# Patient Record
Sex: Female | Born: 1985 | Race: White | Hispanic: No | Marital: Single | Smoking: Current every day smoker
Health system: Southern US, Community
[De-identification: ages and names within clinical notes are randomized; demographics above are authoritative.]

## PROBLEM LIST (undated history)

## (undated) HISTORY — PX: LIVER SURGERY: SHX698

## (undated) HISTORY — PX: CHOLECYSTECTOMY: SHX55

## (undated) HISTORY — PX: TUBAL LIGATION: SHX77

---

## 2014-04-07 ENCOUNTER — Emergency Department: Payer: Self-pay | Admitting: Emergency Medicine

## 2014-04-07 LAB — LIPASE, BLOOD: Lipase: 79 U/L (ref 73–393)

## 2014-04-07 LAB — CBC WITH DIFFERENTIAL/PLATELET
BASOS PCT: 0.5 %
Basophil #: 0.1 10*3/uL (ref 0.0–0.1)
EOS PCT: 0.6 %
Eosinophil #: 0.1 10*3/uL (ref 0.0–0.7)
HCT: 43.9 % (ref 35.0–47.0)
HGB: 14.5 g/dL (ref 12.0–16.0)
LYMPHS ABS: 1.2 10*3/uL (ref 1.0–3.6)
Lymphocyte %: 10.1 %
MCH: 29.7 pg (ref 26.0–34.0)
MCHC: 33 g/dL (ref 32.0–36.0)
MCV: 90 fL (ref 80–100)
MONOS PCT: 3.6 %
Monocyte #: 0.4 x10 3/mm (ref 0.2–0.9)
NEUTROS ABS: 9.9 10*3/uL — AB (ref 1.4–6.5)
Neutrophil %: 85.2 %
Platelet: 168 10*3/uL (ref 150–440)
RBC: 4.87 10*6/uL (ref 3.80–5.20)
RDW: 14.6 % — ABNORMAL HIGH (ref 11.5–14.5)
WBC: 11.6 10*3/uL — AB (ref 3.6–11.0)

## 2014-04-07 LAB — COMPREHENSIVE METABOLIC PANEL
ALBUMIN: 3.9 g/dL (ref 3.4–5.0)
ALK PHOS: 43 U/L — AB
ALT: 33 U/L
Anion Gap: 8 (ref 7–16)
BUN: 9 mg/dL (ref 7–18)
Bilirubin,Total: 0.4 mg/dL (ref 0.2–1.0)
CO2: 22 mmol/L (ref 21–32)
CREATININE: 0.46 mg/dL — AB (ref 0.60–1.30)
Calcium, Total: 8.7 mg/dL (ref 8.5–10.1)
Chloride: 109 mmol/L — ABNORMAL HIGH (ref 98–107)
EGFR (African American): >60
Glucose: 119 mg/dL — ABNORMAL HIGH (ref 65–99)
Osmolality: 277 (ref 275–301)
Potassium: 5.1 mmol/L (ref 3.5–5.1)
SGOT(AST): 62 U/L — ABNORMAL HIGH (ref 15–37)
Sodium: 139 mmol/L (ref 136–145)
Total Protein: 7.5 g/dL (ref 6.4–8.2)

## 2014-04-07 LAB — URINALYSIS, COMPLETE
BILIRUBIN, UR: NEGATIVE
Bacteria: NONE SEEN
Blood: NEGATIVE
GLUCOSE, UR: NEGATIVE mg/dL (ref 0–75)
Leukocyte Esterase: NEGATIVE
NITRITE: NEGATIVE
PH: 8 (ref 4.5–8.0)
PROTEIN: NEGATIVE
RBC,UR: NONE SEEN /HPF (ref 0–5)
SPECIFIC GRAVITY: 1.013 (ref 1.003–1.030)
Squamous Epithelial: 41
WBC UR: 2 /HPF (ref 0–5)

## 2014-04-07 LAB — HCG, QUANTITATIVE, PREGNANCY: Beta Hcg, Quant.: <1 m[IU]/mL — ABNORMAL LOW

## 2015-12-20 ENCOUNTER — Encounter: Payer: Self-pay | Admitting: Emergency Medicine

## 2015-12-20 ENCOUNTER — Emergency Department
Admission: EM | Admit: 2015-12-20 | Discharge: 2015-12-20 | Disposition: A | Discharge status: Home / Self Care | Payer: Medicaid Other | Attending: Emergency Medicine | Admitting: Emergency Medicine

## 2015-12-20 DIAGNOSIS — J029 Acute pharyngitis, unspecified: Secondary | ICD-10-CM | POA: Diagnosis present

## 2015-12-20 DIAGNOSIS — F17200 Nicotine dependence, unspecified, uncomplicated: Secondary | ICD-10-CM | POA: Insufficient documentation

## 2015-12-20 LAB — POCT RAPID STREP A: STREPTOCOCCUS, GROUP A SCREEN (DIRECT): NEGATIVE

## 2015-12-20 MED ORDER — AMOXICILLIN 500 MG PO TABS
500.0000 mg | ORAL_TABLET | Freq: Two times a day (BID) | ORAL | Status: DC
Start: 2015-12-20 — End: 2016-01-24

## 2015-12-20 NOTE — ED Provider Notes (Signed)
[facility] Emergency Department Provider Note  ____________________________________________  Time seen: Approximately 8:03 AM  I have reviewed the triage vital signs and the nursing notes.   HISTORY  Chief Complaint Sore Throat   HPI Jean Ramirez is a 30 y.o. female, NAD, presents to the emergency room today with a four day history of sore throat and cough. She states the sore throat began on Saturday and progressively worsened. The cough is productive of green-yellow phlegm. She states that her throat and neck feel swollen and it has become painful to swallow. She has taken tylenol and used cough drops with no relief. Denies fever, rhinorrhea, sinus pain, or chest congestion.  History reviewed. No pertinent past medical history.  There are no active problems to display for this patient.   Past Surgical History  Procedure Laterality Date  . Cholecystectomy    . Liver surgery      Current Outpatient Rx  Name  Route  Sig  Dispense  Refill  . amoxicillin (AMOXIL) 500 MG tablet   Oral   Take 1 tablet (500 mg total) by mouth 2 (two) times daily.   20 tablet   0     Allergies Review of patient's allergies indicates no known allergies.  History reviewed. No pertinent family history.  Social History Social History  Substance Use Topics  . Smoking status: Current Every Day Smoker  . Smokeless tobacco: None  . Alcohol Use: No    Review of Systems Constitutional: Negative for fever. Eyes: No visual changes. ENT: Positive for sore throat; Positive for difficulty swallowing. Respiratory: Denies shortness of breath. Gastrointestinal: No abdominal pain.  No nausea, no vomiting.  No diarrhea.  Genitourinary: Negative for dysuria. Musculoskeletal: Negative for generalized body aches. Skin: Negative for rash. Neurological: Negative for headaches, focal weakness or numbness.  10-point ROS otherwise  negative.  ____________________________________________   PHYSICAL EXAM:  VITAL SIGNS: ED Triage Vitals  Enc Vitals Group     BP 12/20/15 0756 115/87 mmHg     Pulse Rate 12/20/15 0756 95     Resp 12/20/15 0756 18     Temp 12/20/15 0756 97.7 F (36.5 C)     Temp Source 12/20/15 0756 Oral     SpO2 12/20/15 0756 97 %     Weight 12/20/15 0756 105 lb (47.628 kg)     Height 12/20/15 0756  (1.549 m)     Head Cir --      Peak Flow --      Pain Score --      Pain Loc --      Pain Edu? --      Excl. in GC? --    Constitutional: Alert and oriented. Well appearing and in no acute distress. Eyes: Conjunctivae are normal. PERRL. EOMI. Head: Atraumatic. Nose: No congestion/rhinnorhea. Mouth/Throat: Mucous membranes are moist.  Oropharynx erythematous and mildly swollen, without exudate. Neck: No stridor.  Lymphatic: Lymphadenopathy: mild left-sided tonsillar lymphadenopathy Cardiovascular: Normal rate, regular rhythm. Good peripheral circulation. Respiratory: Normal respiratory effort. Lungs CTAB. Gastrointestinal: Soft and nontender. Musculoskeletal: No lower extremity tenderness nor edema.   Neurologic:  Normal speech and language. No gross focal neurologic deficits are appreciated. Speech is normal. No gait instability. Skin:  Skin is warm, dry and intact. No rash noted Psychiatric: Mood and affect are normal. Speech and behavior are normal.  ____________________________________________   LABS (all labs ordered are listed, but only abnormal results are displayed)  Labs Reviewed  POCT RAPID STREP A  ____________________________________________  EKG   ____________________________________________  RADIOLOGY   ____________________________________________   PROCEDURES  Procedure(s) performed: None  Critical Care performed: No  ____________________________________________   INITIAL IMPRESSION / ASSESSMENT AND PLAN / ED COURSE  Pertinent labs & imaging  results that were available during my care of the patient were reviewed by me and considered in my medical decision making (see chart for details).  Patient states that her symptoms seem to be worse each day. She has tried tylenol without relief. She will be given a prescription for amoxicillin today. She was advised to return to the ER or follow up with the PCP of her choice for symptoms that are not improving over the next few days. ____________________________________________   FINAL CLINICAL IMPRESSION(S) / ED DIAGNOSES  Final diagnoses:  Acute pharyngitis, unspecified etiology      Chinita PesterCari B Richa Shor, FNP 12/20/15 1016  Jennye MoccasinBrian S Quigley, MD 12/20/15 1120

## 2015-12-20 NOTE — Discharge Instructions (Signed)

## 2015-12-20 NOTE — ED Notes (Signed)
Pt presents with sore throat and swollen glands. Pt reports cough with green phlegm; no cough noted during assessment or triage. Pt alert & oriented with NAD noted.

## 2015-12-20 NOTE — ED Notes (Signed)
Pt presents with sore throat and swollen glands on left side x 4 days. No fever, chills reported. NAD noted.

## 2015-12-20 NOTE — ED Notes (Signed)
Pt discharged home after verbalizing understanding of discharge instructions; nad noted. 

## 2016-01-24 ENCOUNTER — Emergency Department
Admission: EM | Admit: 2016-01-24 | Discharge: 2016-01-25 | Disposition: A | Discharge status: Home / Self Care | Payer: Medicaid Other | Attending: Emergency Medicine | Admitting: Emergency Medicine

## 2016-01-24 ENCOUNTER — Encounter: Payer: Self-pay | Admitting: Emergency Medicine

## 2016-01-24 DIAGNOSIS — F10129 Alcohol abuse with intoxication, unspecified: Secondary | ICD-10-CM | POA: Insufficient documentation

## 2016-01-24 DIAGNOSIS — F332 Major depressive disorder, recurrent severe without psychotic features: Secondary | ICD-10-CM | POA: Diagnosis not present

## 2016-01-24 DIAGNOSIS — Y999 Unspecified external cause status: Secondary | ICD-10-CM | POA: Diagnosis not present

## 2016-01-24 DIAGNOSIS — S0083XA Contusion of other part of head, initial encounter: Secondary | ICD-10-CM | POA: Diagnosis not present

## 2016-01-24 DIAGNOSIS — R45851 Suicidal ideations: Secondary | ICD-10-CM | POA: Diagnosis present

## 2016-01-24 DIAGNOSIS — W1839XA Other fall on same level, initial encounter: Secondary | ICD-10-CM | POA: Insufficient documentation

## 2016-01-24 DIAGNOSIS — R51 Headache: Secondary | ICD-10-CM | POA: Diagnosis not present

## 2016-01-24 DIAGNOSIS — Z046 Encounter for general psychiatric examination, requested by authority: Secondary | ICD-10-CM

## 2016-01-24 DIAGNOSIS — F10920 Alcohol use, unspecified with intoxication, uncomplicated: Secondary | ICD-10-CM

## 2016-01-24 DIAGNOSIS — F17200 Nicotine dependence, unspecified, uncomplicated: Secondary | ICD-10-CM | POA: Diagnosis not present

## 2016-01-24 DIAGNOSIS — Y929 Unspecified place or not applicable: Secondary | ICD-10-CM | POA: Diagnosis not present

## 2016-01-24 DIAGNOSIS — Y9389 Activity, other specified: Secondary | ICD-10-CM | POA: Diagnosis not present

## 2016-01-24 DIAGNOSIS — F10929 Alcohol use, unspecified with intoxication, unspecified: Secondary | ICD-10-CM

## 2016-01-24 DIAGNOSIS — W19XXXA Unspecified fall, initial encounter: Secondary | ICD-10-CM

## 2016-01-24 DIAGNOSIS — F1219 Cannabis abuse with unspecified cannabis-induced disorder: Secondary | ICD-10-CM | POA: Insufficient documentation

## 2016-01-24 DIAGNOSIS — F1210 Cannabis abuse, uncomplicated: Secondary | ICD-10-CM

## 2016-01-24 LAB — POCT PREGNANCY, URINE: Preg Test, Ur: NEGATIVE

## 2016-01-24 LAB — CBC
HCT: 39 % (ref 35.0–47.0)
Hemoglobin: 13.4 g/dL (ref 12.0–16.0)
MCH: 29.4 pg (ref 26.0–34.0)
MCHC: 34.3 g/dL (ref 32.0–36.0)
MCV: 85.7 fL (ref 80.0–100.0)
PLATELETS: 163 10*3/uL (ref 150–440)
RBC: 4.54 MIL/uL (ref 3.80–5.20)
RDW: 16.3 % — AB (ref 11.5–14.5)
WBC: 7.9 10*3/uL (ref 3.6–11.0)

## 2016-01-24 MED ORDER — SODIUM CHLORIDE 0.9 % IV BOLUS (SEPSIS)
1000.0000 mL | Freq: Once | INTRAVENOUS | Status: AC
  Administered 2016-01-25: 1000 mL via INTRAVENOUS

## 2016-01-24 NOTE — ED Provider Notes (Signed)
[facility] Emergency Department Provider Note   ____________________________________________  Time seen: Approximately 11:58 PM  I have reviewed the triage vital signs and the nursing notes.   HISTORY  Chief Complaint Fall and Psychiatric Evaluation  Limited secondary to intoxication  HPI Jean Ramirez is a [age] y.o. female brought by her friend to the ED for a chief complaint of fall with facial pain, intoxication and suicidal thoughts. Friend statespatient had been drinking heavily and fell, striking face. Triage nurse reports patient voicing suicidal thoughts without plan and also requesting detox. Complains of facial pain secondary to fall. Rest of history is limited secondary to patient's intoxication.   Past medical history None  There are no active problems to display for this patient.   Past Surgical History  Procedure Laterality Date  . Cholecystectomy    . Liver surgery      No current outpatient prescriptions on file.  Allergies Review of patient's allergies indicates no known allergies.  No family history on file.  Social History Social History  Substance Use Topics  . Smoking status: Current Every Day Smoker -- 1.00 packs/day  . Smokeless tobacco: None  . Alcohol Use: Yes    Review of Systems  Constitutional: No fever/chills. Eyes: No visual changes. ENT: Positive for facial pain. No sore throat. Cardiovascular: Denies chest pain. Respiratory: Denies shortness of breath. Gastrointestinal: No abdominal pain.  No nausea, no vomiting.  No diarrhea.  No constipation. Genitourinary: Negative for dysuria. Musculoskeletal: Negative for back pain. Skin: Negative for rash. Neurological: Negative for headaches, focal weakness or numbness. Psychiatric:Positive for suicidal thoughts.  10-point ROS otherwise negative.  ____________________________________________   PHYSICAL EXAM:  VITAL SIGNS: ED Triage Vitals  Enc  Vitals Group     BP 01/24/16 2313 133/74 mmHg     Pulse Rate 01/24/16 2313 104     Resp 01/24/16 2313 20     Temp 01/24/16 2313 98.1 F (36.7 C)     Temp Source 01/24/16 2313 Oral     SpO2 01/24/16 2313 98 %     Weight 01/24/16 2313 115 lb (52.164 kg)     Height 01/24/16 2313 5\' 1"  (1.549 m)     Head Cir --      Peak Flow --      Pain Score 01/24/16 2315 10     Pain Loc --      Pain Edu? --      Excl. in GC? --     Constitutional: Alert and oriented. Well appearing and in mild acute distress. Intoxicated. Eyes: Conjunctivae are normal. PERRL. EOMI. Abrasion to left eye. Head: Multiple facial abrasions.  Nose: No congestion/rhinnorhea. Mouth/Throat: Dried blood to upper lip. Mucous membranes are moist.  Oropharynx non-erythematous.  Chipped upper front tooth.  No dental malocclusion. Neck: No stridor.  No cervical spine step-offs or deformities noted. Cardiovascular: Normal rate, regular rhythm. Grossly normal heart sounds.  Good peripheral circulation. Respiratory: Normal respiratory effort.  No retractions. Lungs CTAB. Gastrointestinal: Soft and nontender. No distention. No abdominal bruits. No CVA tenderness. Musculoskeletal: Abrasion to left shoulder. Full range of motion without pain. No lower extremity tenderness nor edema.  No joint effusions. Neurologic:  Intoxicated. Normal speech and language. No gross focal neurologic deficits are appreciated.  Skin:  Skin is warm, dry and intact. No rash noted. Psychiatric: Mood and affect are flat. Speech and behavior are normal.  ____________________________________________   LABS (all labs ordered are listed, but only abnormal results are displayed)  Labs Reviewed  COMPREHENSIVE METABOLIC PANEL - Abnormal; Notable for the following:    Potassium 3.3 (*)    Glucose, Bld 105 (*)    All other components within normal limits  ETHANOL - Abnormal; Notable for the following:    Alcohol, Ethyl (B) 189 (*)    All other components  within normal limits  ACETAMINOPHEN LEVEL - Abnormal; Notable for the following:    Acetaminophen (Tylenol), Serum <10 (*)    All other components within normal limits  CBC - Abnormal; Notable for the following:    RDW 16.3 (*)    All other components within normal limits  URINE DRUG SCREEN, QUALITATIVE ([facility] ONLY) - Abnormal; Notable for the following:    Cannabinoid 50 Ng, Ur Angelica POSITIVE (*)    Benzodiazepine, Ur Scrn POSITIVE (*)    All other components within normal limits  SALICYLATE LEVEL  POC URINE PREG, ED  POCT PREGNANCY, URINE   ____________________________________________  EKG  None ____________________________________________  RADIOLOGY  CT head/cervical spine/maxillofacial interpreted per Dr. Manus Gunning: 1. No acute intracranial abnormality. 2. No facial bone fracture. 3. No acute fracture or subluxation of cervical spine. ____________________________________________   PROCEDURES  Procedure(s) performed: None  Critical Care performed: No  ____________________________________________   INITIAL IMPRESSION / ASSESSMENT AND PLAN / ED COURSE  Pertinent labs & imaging results that were available during my care of the patient were reviewed by me and considered in my medical decision making (see chart for details).  30 year old intoxicated female who presents with facial contusion secondary to fall. Also voiced suicidal thoughts to triage nurse. Patient was yelling when she was brought back to the treatment room; attempting to leave the premises. She was placed under involuntary commitment secondary to being a danger to herself. She did not require sedation as she was able to be verbally redirected. Will place in cervical collar and obtain CT imaging studies of head, cervical spine and maxillofacial.  ----------------------------------------- 2:09 AM on 01/25/2016 -----------------------------------------  Updated patient of imaging results. Cervical collar  removed. IV fluids infusing.  ----------------------------------------- 7:00 AM on 01/25/2016 -----------------------------------------  Patient resting in no acute distress. She is medically cleared. Will remain under IVC pending psychiatric evaluation and disposition. ____________________________________________   FINAL CLINICAL IMPRESSION(S) / ED DIAGNOSES  Final diagnoses:  Alcohol intoxication, uncomplicated (HCC)  Fall, initial encounter  Facial contusion, initial encounter  Marijuana abuse      NEW MEDICATIONS STARTED DURING THIS VISIT:  New Prescriptions   No medications on file     Note:  This document was prepared using Dragon voice recognition software and may include unintentional dictation errors.    Irean Hong, MD 01/25/16 [phone]

## 2016-01-24 NOTE — ED Notes (Addendum)
Pt presents to triage in wheelchair, pt tearful. C/o facial pain after falling 1 hour PTA. Per pt friend, pt has been drinking tonight and fell, hit face on sidewalk. Laceration noted to upper lip, abrasion to left eye and left shoulder, and chipped upper front tooth. Pt intoxicated at triage. Pt alert, respirations even and unlabored. Pt reports having suicidal thoughts with no plan, reports is wanting detox. Pt keeps stating "no one cares for me."

## 2016-01-24 NOTE — ED Notes (Signed)
Mayra, EDT in triage to complete protocols and change pt into behav scrubs

## 2016-01-25 ENCOUNTER — Emergency Department: Payer: Medicaid Other

## 2016-01-25 DIAGNOSIS — F332 Major depressive disorder, recurrent severe without psychotic features: Secondary | ICD-10-CM

## 2016-01-25 DIAGNOSIS — F10929 Alcohol use, unspecified with intoxication, unspecified: Secondary | ICD-10-CM

## 2016-01-25 DIAGNOSIS — Z046 Encounter for general psychiatric examination, requested by authority: Secondary | ICD-10-CM

## 2016-01-25 LAB — URINE DRUG SCREEN, QUALITATIVE (ARMC ONLY)
Amphetamines, Ur Screen: NOT DETECTED
BARBITURATES, UR SCREEN: NOT DETECTED
BENZODIAZEPINE, UR SCRN: POSITIVE — AB
CANNABINOID 50 NG, UR ~~LOC~~: POSITIVE — AB
Cocaine Metabolite,Ur ~~LOC~~: NOT DETECTED
MDMA (Ecstasy)Ur Screen: NOT DETECTED
METHADONE SCREEN, URINE: NOT DETECTED
OPIATE, UR SCREEN: NOT DETECTED
Phencyclidine (PCP) Ur S: NOT DETECTED
Tricyclic, Ur Screen: NOT DETECTED

## 2016-01-25 LAB — SALICYLATE LEVEL

## 2016-01-25 LAB — COMPREHENSIVE METABOLIC PANEL
ALK PHOS: 59 U/L (ref 38–126)
ALT: 19 U/L (ref 14–54)
ANION GAP: 9 (ref 5–15)
AST: 24 U/L (ref 15–41)
Albumin: 4.6 g/dL (ref 3.5–5.0)
BUN: 13 mg/dL (ref 6–20)
CO2: 25 mmol/L (ref 22–32)
Calcium: 9.2 mg/dL (ref 8.9–10.3)
Chloride: 110 mmol/L (ref 101–111)
Creatinine, Ser: 0.52 mg/dL (ref 0.44–1.00)
Glucose, Bld: 105 mg/dL — ABNORMAL HIGH (ref 65–99)
Potassium: 3.3 mmol/L — ABNORMAL LOW (ref 3.5–5.1)
SODIUM: 144 mmol/L (ref 135–145)
TOTAL PROTEIN: 7.7 g/dL (ref 6.5–8.1)
Total Bilirubin: 0.3 mg/dL (ref 0.3–1.2)

## 2016-01-25 LAB — ACETAMINOPHEN LEVEL: Acetaminophen (Tylenol), Serum: <10 ug/mL — ABNORMAL LOW (ref 10–30)

## 2016-01-25 LAB — ETHANOL: Alcohol, Ethyl (B): 189 mg/dL — ABNORMAL HIGH

## 2016-01-25 MED ORDER — ACETAMINOPHEN 500 MG PO TABS
1000.0000 mg | ORAL_TABLET | Freq: Once | ORAL | Status: AC
  Administered 2016-01-25: 1000 mg via ORAL

## 2016-01-25 MED ORDER — TRAZODONE HCL 100 MG PO TABS: 100.0000 mg | ORAL_TABLET | Freq: Every day | ORAL | Status: AC

## 2016-01-25 MED ORDER — FLUOXETINE HCL 20 MG PO CAPS: 20.0000 mg | ORAL_CAPSULE | Freq: Every day | ORAL | Status: AC

## 2016-01-25 MED ORDER — IBUPROFEN 600 MG PO TABS
600.0000 mg | ORAL_TABLET | Freq: Once | ORAL | Status: AC
  Administered 2016-01-25: 600 mg via ORAL

## 2016-01-25 MED ORDER — KETOROLAC TROMETHAMINE 30 MG/ML IJ SOLN
10.0000 mg | Freq: Once | INTRAMUSCULAR | Status: AC
  Administered 2016-01-25: 9.9 mg via INTRAVENOUS

## 2016-01-25 NOTE — ED Notes (Signed)
BEHAVIORAL HEALTH ROUNDING Patient sleeping: No. Patient alert and oriented: yes Behavior appropriate: Yes.  ; If no, describe:  Nutrition and fluids offered: yes Toileting and hygiene offered: Yes  Sitter present: q15 minute observations and security  monitoring Law enforcement present: Yes  ODS  

## 2016-01-25 NOTE — ED Notes (Signed)
BEHAVIORAL HEALTH ROUNDING Patient sleeping: Yes.   Patient alert and oriented: eyes closed  Appears to be asleep Behavior appropriate: Yes.  ; If no, describe:  Nutrition and fluids offered:  Sleeping  Toileting and hygiene offered: sleeping Sitter present: q 15 minute observations and security  monitoring Law enforcement present: yes  ODS  ENVIRONMENTAL ASSESSMENT Potentially harmful objects out of patient reach: Yes.   Personal belongings secured: Yes.   Patient dressed in hospital provided attire only: Yes.   Plastic bags out of patient reach: Yes.   Patient care equipment (cords, cables, call bells, lines, and drains) shortened, removed, or accounted for: Yes.   Equipment and supplies removed from bottom of stretcher: Yes.   Potentially toxic materials out of patient reach: Yes.   Sharps container removed or out of patient reach: Yes.

## 2016-01-25 NOTE — ED Notes (Signed)
Pt became agitated and tearful hollering out "wanting to leave, you cant keep be here, I am not sucidal, and want to go home." When can I leave? " Explained to pt cant leave under IVC and awaiting Psych consult. Pt kept hollering out and tearful. IV fluids barely infusions due to pt moving arms all over the place. ODS and [employer] officers at the bedside.

## 2016-01-25 NOTE — ED Notes (Signed)
Pt continues to sleep quietly  NAD observed

## 2016-01-25 NOTE — ED Notes (Signed)
TTS just seen patient and agrees pt needs to stay to be eval by psych in am. Pt is agitated and tearful. Wants to go home. ODS and  [employer] officers at the bedside.

## 2016-01-25 NOTE — BH Assessment (Signed)
Per request of Psych MD (Clapacs), writer provided the pt. with information and instructions on how to access Outpatient Mental Health & Substance Abuse Treatment ([facility]).  Patient denies SI/HI and AV/H.

## 2016-01-25 NOTE — ED Provider Notes (Signed)
-----------------------------------------   3:28 PM on 01/25/2016 -----------------------------------------   Blood pressure 107/60, pulse 68, temperature 98.2 F (36.8 C), temperature source Tympanic, resp. rate 18, height 5\' 1"  (1.549 m), weight 115 lb (52.164 kg), last menstrual period 12/26/2015, SpO2 99 %.  The patient had no acute events since last update.  Calm and cooperative at this time.  Disposition is pending per Psychiatry/Behavioral Medicine team recommendations.   Patient has been cleared by psychiatry and felt to be stable for discharge and received medications for Prozac and trazodone. She'll also be given local psychiatric services.  Jennye MoccasinBrian S Legend Tumminello, MD 01/25/16 [phone]

## 2016-01-25 NOTE — ED Notes (Signed)
An ice pack provided for edema of her lips  -   Sprite provided  assessment completed  She reports 5/10 pain and I will get something ordered for her pain  Plan of care discussed including IVC, pending psych eval

## 2016-01-25 NOTE — BH Assessment (Signed)
Assessment Note  Jean Ramirez is an 30 y.o. female presenting to the ED, via a friend, after drinking heavily and falling and hitting her face.  Pt also reportedly voice suicidal thoughts and to detox from alcohol.  Pt reports she drank 4 Locos tonight.  Pt BAC = 189; UDS positive for Benzos and marijuana.  Patient tearful during the assessment.  She states that she wish she could take back everything she said.  She denies any SI or HI.  She denies wanting detox at this time.  Pt denies any concerns of auditory/visual hallucinations.   Diagnosis: Alcohol Intoxication  Past Medical History: History reviewed. No pertinent past medical history.  Past Surgical History  Procedure Laterality Date  . Cholecystectomy    . Liver surgery      Family History: No family history on file.  Social History:  reports that she has been smoking.  She does not have any smokeless tobacco history on file. She reports that she drinks alcohol. Her drug history is not on file.  Additional Social History:  Alcohol / Drug Use History of alcohol / drug use?: Yes Longest period of sobriety (when/how long): can't remember  CIWA: CIWA-Ar BP: 133/74 mmHg Pulse Rate: (!) 104 COWS:    Allergies: No Known Allergies  Home Medications:  (Not in a hospital admission)  OB/GYN Status:  Patient's last menstrual period was 12/26/2015.  General Assessment Data Location of Assessment: [facility] ED TTS Assessment: In system Is this a Tele or Face-to-Face Assessment?: Face-to-Face Is this an Initial Assessment or a Re-assessment for this encounter?: Initial Assessment Marital status: Single Maiden name: N/A Is patient pregnant?: No Pregnancy Status: No Living Arrangements: Non-relatives/Friends Can pt return to current living arrangement?: Yes Admission Status: Involuntary Is patient capable of signing voluntary admission?: No Referral Source: Self/Family/Friend Insurance type: Medicaid     Crisis Care  Plan Living Arrangements: Non-relatives/Friends Legal Guardian: Other: (self) Name of Psychiatrist: None reported Name of Therapist: None reported  Education Status Is patient currently in school?: No Current Grade: N/A Highest grade of school patient has completed: N/A Name of school: N/A Contact person: N/a  Risk to self with the past 6 months Suicidal Ideation: Yes-Currently Present Has patient been a risk to self within the past 6 months prior to admission? : No Suicidal Intent: No Has patient had any suicidal intent within the past 6 months prior to admission? : No Is patient at risk for suicide?: No Suicidal Plan?: No Has patient had any suicidal plan within the past 6 months prior to admission? : No Access to Means: No What has been your use of drugs/alcohol within the last 12 months?: 4 locos, marijuana Previous Attempts/Gestures: No (Pt denies any prior attempts) How many times?: 0 Other Self Harm Risks: alcohol and drug abuse Triggers for Past Attempts: None known Intentional Self Injurious Behavior: None Family Suicide History: No Recent stressful life event(s): Conflict (Comment) (conflict with others) Persecutory voices/beliefs?: No Depression: No Substance abuse history and/or treatment for substance abuse?: No Suicide prevention information given to non-admitted patients: Not applicable  Risk to Others within the past 6 months Homicidal Ideation: No Does patient have any lifetime risk of violence toward others beyond the six months prior to admission? : No Thoughts of Harm to Others: No Current Homicidal Intent: No Current Homicidal Plan: No Access to Homicidal Means: No Identified Victim: None identified History of harm to others?: No Assessment of Violence: None Noted Violent Behavior Description: None identified Does patient  have access to weapons?: No Criminal Charges Pending?: No Does patient have a court date: No Is patient on probation?:  Unknown  Psychosis Hallucinations: None noted Delusions: None noted  Mental Status Report Appearance/Hygiene: In scrubs Eye Contact: Fair Motor Activity: Agitation, Freedom of movement Speech: Argumentative, Slurred Level of Consciousness: Irritable, Crying, Alert Mood: Irritable, Anxious Affect: Anxious, Irritable Anxiety Level: Minimal Thought Processes: Relevant Judgement: Impaired Orientation: Person, Place, Time, Situation Obsessive Compulsive Thoughts/Behaviors: None  Cognitive Functioning Concentration: Normal Memory: Recent Intact, Remote Intact IQ: Average Insight: Fair Impulse Control: Fair Appetite: Fair Weight Loss: 0 Weight Gain: 0 Sleep: No Change Vegetative Symptoms: None  ADLScreening [facility] Assessment Services) Patient's cognitive ability adequate to safely complete daily activities?: Yes Patient able to express need for assistance with ADLs?: Yes Independently performs ADLs?: Yes (appropriate for developmental age)  Prior Inpatient Therapy Prior Inpatient Therapy: No Prior Therapy Dates: N/A Prior Therapy Facilty/Provider(s): N/A Reason for Treatment: N/A  Prior Outpatient Therapy Prior Outpatient Therapy: No Prior Therapy Dates: N/A Prior Therapy Facilty/Provider(s): N/A Reason for Treatment: N/A Does patient have an ACCT team?: No Does patient have Intensive In-House Services?  : No Does patient have [facility] services? : No Does patient have P4CC services?: No  ADL Screening (condition at time of admission) Patient's cognitive ability adequate to safely complete daily activities?: Yes Patient able to express need for assistance with ADLs?: Yes Independently performs ADLs?: Yes (appropriate for developmental age)       Abuse/Neglect Assessment (Assessment to be complete while patient is alone) Physical Abuse: Denies Verbal Abuse: Denies Sexual Abuse: Denies Exploitation of patient/patient's resources: Denies Self-Neglect:  Denies Values / Beliefs Cultural Requests During Hospitalization: None Spiritual Requests During Hospitalization: None Consults Spiritual Care Consult Needed: No Social Work Consult Needed: No Merchant navy officerAdvance Directives (For Healthcare) Does patient have an advance directive?: No Would patient like information on creating an advanced directive?: No - patient declined information    Additional Information 1:1 In Past 12 Months?: No CIRT Risk: No Elopement Risk: No Does patient have medical clearance?: Yes     Disposition:  Disposition Initial Assessment Completed for this Encounter: Yes Disposition of Patient: Other dispositions Other disposition(s): Other (Comment) (Psych MD consult)  On Site Evaluation by:   Reviewed with Physician:    Artist Beachoxana C Hidaya Daniel 01/25/2016 2:58 AM
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
## 2016-01-25 NOTE — ED Notes (Signed)
Pt with a visit from her "sister"  Observed for 15 minutes by myself  Pt observed with no unusual behavior  tearful at times during visit  Appropriate to stimulation  No verbalized needs or concerns at this time  NAD assessed    Psych consult pending   Lunch and sprite provided  Continue to monitor

## 2016-01-25 NOTE — ED Notes (Signed)
ED BHU PLACEMENT JUSTIFICATION Is the patient under IVC or is there intent for IVC: Yes.   Is the patient medically cleared: Yes.   Is there vacancy in the ED BHU: Yes.   Is the population mix appropriate for patient: Yes.   Is the patient awaiting placement in inpatient or outpatient setting:   Has the patient had a psychiatric consult:  Consult pending Survey of unit performed for contraband, proper placement and condition of furniture, tampering with fixtures in bathroom, shower, and each patient room: Yes.  ; Findings:  APPEARANCE/BEHAVIOR Tearful - scared  cooperative NEURO ASSESSMENT Orientation: oriented x4 Denies pain Hallucinations: No.None noted (Hallucinations) Speech: Normal Gait: normal RESPIRATORY ASSESSMENT Even  Unlabored respirations  CARDIOVASCULAR ASSESSMENT Pulses equal   regular rate  Skin warm and dry   GASTROINTESTINAL ASSESSMENT no GI complaint EXTREMITIES Full ROM  PLAN OF CARE Provide calm/safe environment. Vital signs assessed twice daily. ED BHU Assessment once each 12-hour shift. Collaborate with intake RN daily or as condition indicates. Assure the ED provider has rounded once each shift. Provide and encourage hygiene. Provide redirection as needed. Assess for escalating behavior; address immediately and inform ED provider.  Assess family dynamic and appropriateness for visitation as needed: Yes.  ; If necessary, describe findings:  Educate the patient/family about BHU procedures/visitation: Yes.  ; If necessary, describe findings:

## 2016-01-25 NOTE — Discharge Instructions (Signed)
Alcohol Intoxication  Alcohol intoxication occurs when the amount of alcohol that a person has consumed impairs his or her ability to mentally and physically function. Alcohol directly impairs the normal chemical activity of the brain. Drinking large amounts of alcohol can lead to changes in mental function and behavior, and it can cause many physical effects that can be harmful.   Alcohol intoxication can range in severity from mild to very severe. Various factors can affect the level of intoxication that occurs, such as the person's age, gender, weight, frequency of alcohol consumption, and the presence of other medical conditions (such as diabetes, seizures, or heart conditions). Dangerous levels of alcohol intoxication may occur when people drink large amounts of alcohol in a short period (binge drinking). Alcohol can also be especially dangerous when combined with certain prescription medicines or "recreational" drugs.  SIGNS AND SYMPTOMS  Some common signs and symptoms of mild alcohol intoxication include:  · Loss of coordination.  · Changes in mood and behavior.  · Impaired judgment.  · Slurred speech.  As alcohol intoxication progresses to more severe levels, other signs and symptoms will appear. These may include:  · Vomiting.  · Confusion and impaired memory.  · Slowed breathing.  · Seizures.  · Loss of consciousness.  DIAGNOSIS   Your health care provider will take a medical history and perform a physical exam. You will be asked about the amount and type of alcohol you have consumed. Blood tests will be done to measure the concentration of alcohol in your blood. In many places, your blood alcohol level must be lower than 80 mg/dL (0.08%) to legally drive. However, many dangerous effects of alcohol can occur at much lower levels.   TREATMENT   People with alcohol intoxication often do not require treatment. Most of the effects of alcohol intoxication are temporary, and they go away as the alcohol naturally  leaves the body. Your health care provider will monitor your condition until you are stable enough to go home. Fluids are sometimes given through an IV access tube to help prevent dehydration.   HOME CARE INSTRUCTIONS  · Do not drive after drinking alcohol.  · Stay hydrated. Drink enough water and fluids to keep your urine clear or pale yellow. Avoid caffeine.    · Only take over-the-counter or prescription medicines as directed by your health care provider.    SEEK MEDICAL CARE IF:   · You have persistent vomiting.    · You do not feel better after a few days.  · You have frequent alcohol intoxication. Your health care provider can help determine if you should see a substance use treatment counselor.  SEEK IMMEDIATE MEDICAL CARE IF:   · You become shaky or tremble when you try to stop drinking.    · You shake uncontrollably (seizure).    · You throw up (vomit) blood. This may be bright red or may look like black coffee grounds.    · You have blood in your stool. This may be bright red or may appear as a black, tarry, bad smelling stool.    · You become lightheaded or faint.    MAKE SURE YOU:   · Understand these instructions.  · Will watch your condition.  · Will get help right away if you are not doing well or get worse.     This information is not intended to replace advice given to you by your health care provider. Make sure you discuss any questions you have with your health care provider.       Document Released: 05/23/2005 Document Revised: 04/15/2013 Document Reviewed: 01/16/2013  Elsevier Interactive Patient Education ©2016 [employer]

## 2016-01-25 NOTE — ED Notes (Signed)
Pt in room resting quietly in bed will continue to monitor.

## 2016-01-25 NOTE — ED Notes (Signed)
BEHAVIORAL HEALTH ROUNDING Patient sleeping: No. Patient alert and oriented: yes Behavior appropriate: Yes.  ; If no, describe:  Nutrition and fluids offered: yes Toileting and hygiene offered: Yes  Sitter present: q15 minute observations and security monitoring Law enforcement present: Yes  ODS  1/1 bags of belongings have been returned to her and she verbalizes that she has received back all items brought here by her

## 2016-01-25 NOTE — ED Notes (Signed)
BEHAVIORAL HEALTH ROUNDING Patient sleeping: Yes.   Patient alert and oriented: eyes closed  Appears to be asleep Behavior appropriate: Yes.  ; If no, describe:  Nutrition and fluids offered: sleeping Toileting and hygiene offered: sleeping Sitter present: q 15 minute observations and security monitoring Law enforcement present: yes  ODS 

## 2016-01-25 NOTE — ED Notes (Addendum)
Patient transported to room at this time. Pt is snoring unable to wake up due to ETOH. C-collar in place. Pt unable to assess. Due to ETOH sleeping cant answer questions for TTS right now.

## 2016-01-25 NOTE — ED Notes (Signed)
BEHAVIORAL HEALTH ROUNDING Patient sleeping: Yes.   Patient alert and oriented: eyes closed  Appears to be asleep Behavior appropriate: Yes.  ; If no, describe:  Nutrition and fluids offered:  sleeping Toileting and hygiene offered: sleeping Sitter present: q 15 minute observations and security monitoring Law enforcement present: yes  ODS  Edema and discoloration noted to her mouth  She can be heard snoring

## 2016-01-25 NOTE — ED Notes (Signed)
Patient transported to CT 

## 2016-01-25 NOTE — ED Notes (Signed)
Attempt IV x 2 unsuccessful.

## 2017-01-23 ENCOUNTER — Emergency Department
Admission: EM | Admit: 2017-01-23 | Discharge: 2017-01-24 | Disposition: A | Discharge status: Home / Self Care | Payer: Medicaid Other | Attending: Emergency Medicine | Admitting: Emergency Medicine

## 2017-01-23 DIAGNOSIS — R109 Unspecified abdominal pain: Secondary | ICD-10-CM

## 2017-01-23 DIAGNOSIS — F17200 Nicotine dependence, unspecified, uncomplicated: Secondary | ICD-10-CM | POA: Insufficient documentation

## 2017-01-23 DIAGNOSIS — Z79899 Other long term (current) drug therapy: Secondary | ICD-10-CM | POA: Insufficient documentation

## 2017-01-23 DIAGNOSIS — F1910 Other psychoactive substance abuse, uncomplicated: Secondary | ICD-10-CM | POA: Insufficient documentation

## 2017-01-23 DIAGNOSIS — R748 Abnormal levels of other serum enzymes: Secondary | ICD-10-CM

## 2017-01-23 DIAGNOSIS — R945 Abnormal results of liver function studies: Secondary | ICD-10-CM | POA: Insufficient documentation

## 2017-01-23 LAB — COMPREHENSIVE METABOLIC PANEL
ALBUMIN: 4.5 g/dL (ref 3.5–5.0)
ALK PHOS: 86 U/L (ref 38–126)
ALT: 484 U/L — ABNORMAL HIGH (ref 14–54)
ANION GAP: 17 — AB (ref 5–15)
AST: 390 U/L — ABNORMAL HIGH (ref 15–41)
BILIRUBIN TOTAL: 1.6 mg/dL — AB (ref 0.3–1.2)
BUN: 10 mg/dL (ref 6–20)
CALCIUM: 9.4 mg/dL (ref 8.9–10.3)
CO2: 21 mmol/L — ABNORMAL LOW (ref 22–32)
Chloride: 94 mmol/L — ABNORMAL LOW (ref 101–111)
Creatinine, Ser: 0.61 mg/dL (ref 0.44–1.00)
GLUCOSE: 102 mg/dL — AB (ref 65–99)
POTASSIUM: 3.5 mmol/L (ref 3.5–5.1)
Sodium: 132 mmol/L — ABNORMAL LOW (ref 135–145)
TOTAL PROTEIN: 8.5 g/dL — AB (ref 6.5–8.1)

## 2017-01-23 LAB — LIPASE, BLOOD: Lipase: 17 U/L (ref 11–51)

## 2017-01-23 MED ORDER — ONDANSETRON HCL 4 MG/2ML IJ SOLN
4.0000 mg | Freq: Once | INTRAMUSCULAR | Status: AC
  Administered 2017-01-23: 4 mg via INTRAVENOUS

## 2017-01-23 MED ORDER — SODIUM CHLORIDE 0.9 % IV BOLUS (SEPSIS)
1000.0000 mL | Freq: Once | INTRAVENOUS | Status: AC
  Administered 2017-01-23: 1000 mL via INTRAVENOUS

## 2017-01-23 MED ORDER — ONDANSETRON HCL 4 MG/2ML IJ SOLN
4.0000 mg | Freq: Once | INTRAMUSCULAR | Status: AC
  Administered 2017-01-23: 4 mg via INTRAVENOUS
  Filled 2017-01-23: qty 2

## 2017-01-23 MED ORDER — MORPHINE SULFATE (PF) 2 MG/ML IV SOLN
2.0000 mg | Freq: Once | INTRAVENOUS | Status: AC
  Administered 2017-01-23: 2 mg via INTRAVENOUS
  Filled 2017-01-23: qty 1

## 2017-01-23 MED ORDER — ONDANSETRON HCL 4 MG/2ML IJ SOLN
INTRAMUSCULAR | Status: AC
  Filled 2017-01-23: qty 2

## 2017-01-23 NOTE — ED Triage Notes (Signed)
Right sided abdominal pain intermittently X 1 week, worsening tonight.

## 2017-01-23 NOTE — ED Provider Notes (Signed)
[facility] Emergency Department Provider Note   First MD Initiated Contact with Patient 01/23/17 2300     (approximate)  I have reviewed the triage vital signs and the nursing notes.   HISTORY  Chief Complaint Abdominal Pain    HPI Jean Ramirez is a 31 y.o. female with bolus, conditions presents to the emergency department with 9 out of 10 right upper quadrant abdominal pain associated with vomiting 3 days. Patient describes the pain as sharp. Patient denies any fever patient diarrhea no urinary symptoms.   History reviewed. No pertinent past medical history.  Patient Active Problem List   Diagnosis Date Noted  . Alcohol intoxication (HCC) 01/25/2016  . Severe recurrent major depression without psychotic features (HCC) 01/25/2016  . Involuntary commitment 01/25/2016    Past Surgical History:  Procedure Laterality Date  . CHOLECYSTECTOMY    . LIVER SURGERY      Prior to Admission medications   Medication Sig Start Date End Date Taking? Authorizing Provider  FLUoxetine (PROZAC) 20 MG capsule Take 1 capsule (20 mg total) by mouth daily. 01/25/16   Clapacs, Jackquline Denmark, MD  traZODone (DESYREL) 100 MG tablet Take 1 tablet (100 mg total) by mouth at bedtime. 01/25/16   Clapacs, Jackquline Denmark, MD    Allergies No known drug allergies  Family history Noncontributory  Social History Social History  Substance Use Topics  . Smoking status: Current Every Day Smoker    Packs/day: 1.00  . Smokeless tobacco: Not on file  . Alcohol use Yes    Review of Systems Constitutional: No fever/chills Eyes: No visual changes. ENT: No sore throat. Cardiovascular: Denies chest pain. Respiratory: Denies shortness of breath. Gastrointestinal: Positive for abdominal pain and vomiting Genitourinary: Negative for dysuria. Musculoskeletal: Negative for neck pain.  Negative for back pain. Integumentary: Negative for rash. Neurological: Negative for headaches, focal weakness  or numbness.   ____________________________________________   PHYSICAL EXAM:  VITAL SIGNS: ED Triage Vitals [01/23/17 2230]  Enc Vitals Group     BP (!) 141/96     Pulse Rate 95     Resp 18     Temp 98.2 F (36.8 C)     Temp Source Oral     SpO2 98 %     Weight 52.2 kg (115 lb)     Height      Head Circumference      Peak Flow      Pain Score 10     Pain Loc      Pain Edu?      Excl. in GC?     Constitutional: Alert and oriented.Asleep on my arrival to the room, bizarre behavior  Eyes: Conjunctivae are normal. Head: Atraumatic. Mouth/Throat: Mucous membranes are moist.  Oropharynx non-erythematous. Neck: No stridor.   Cardiovascular: Normal rate, regular rhythm. Good peripheral circulation. Grossly normal heart sounds. Respiratory: Normal respiratory effort.  No retractions. Lungs CTAB. Gastrointestinal: Right upper quadrant tenderness palpation. No distention.  Musculoskeletal: No lower extremity tenderness nor edema. No gross deformities of extremities. Neurologic:  Normal speech and language. No gross focal neurologic deficits are appreciated.  Skin:  Skin is warm, dry and intact. No rash noted. Psychiatric: Mood and affect are normal. Speech and behavior are normal.  ____________________________________________   LABS (all labs ordered are listed, but only abnormal results are displayed)  Labs Reviewed  CBC WITH DIFFERENTIAL/PLATELET - Abnormal; Notable for the following:       Result Value   WBC 13.7 (*)  RBC 5.50 (*)    Hemoglobin 16.5 (*)    HCT 47.8 (*)    Platelets 121 (*)    All other components within normal limits  COMPREHENSIVE METABOLIC PANEL - Abnormal; Notable for the following:    Sodium 132 (*)    Chloride 94 (*)    CO2 21 (*)    Glucose, Bld 102 (*)    Total Protein 8.5 (*)    AST 390 (*)    ALT 484 (*)    Total Bilirubin 1.6 (*)    Anion gap 17 (*)    All other components within normal limits  LIPASE, BLOOD  URINALYSIS, ROUTINE  W REFLEX MICROSCOPIC  ETHANOL  URINE DRUG SCREEN, QUALITATIVE ([facility] ONLY)  POC URINE PREG, ED   ____________________________________________   Procedures   ____________________________________________   INITIAL IMPRESSION / ASSESSMENT AND PLAN / ED COURSE  Pertinent labs & imaging results that were available during my care of the patient were reviewed by me and considered in my medical decision making (see chart for details).   31 year old female presenting with abdominal pain very bizarre affect during evaluation such urine drug screen was obtained which revealed positive amphetamines, benzodiazepines cocaine marijuana and opiates. Regarding additional laboratory data patient noted to have markedly elevated liver enzymes. I recommended admission to the patient for further evaluation and possible hepatitis over the patient adamantly refused to stay. I also recommended detoxification which she refused at this time.     ____________________________________________  FINAL CLINICAL IMPRESSION(S) / ED DIAGNOSES  Final diagnoses:  Elevated liver enzymes  Polysubstance abuse  Abdominal pain, unspecified abdominal location     MEDICATIONS GIVEN DURING THIS VISIT:  Medications  morphine 2 MG/ML injection 2 mg (not administered)  ondansetron (ZOFRAN) injection 4 mg (not administered)  ondansetron (ZOFRAN) injection 4 mg (4 mg Intravenous Given 01/23/17 2301)     NEW OUTPATIENT MEDICATIONS STARTED DURING THIS VISIT:  New Prescriptions   No medications on file    Modified Medications   No medications on file    Discontinued Medications   No medications on file     Note:  This document was prepared using Dragon voice recognition software and may include unintentional dictation errors.    Darci CurrentBrown, Erhard N, MD 01/24/17 [phone]

## 2017-01-23 NOTE — ED Notes (Signed)
Pt reports she has had a loss of appetite, pt states she has been drinking a few sips of water and has tried eating however she has emesis after. Pt appears uncomfortable at this time. Pt reports taking peptobismol without relief.

## 2017-01-23 NOTE — ED Notes (Signed)
Pt updated that urine sample is needed. Call bell placed at bedside and pt educated to press call bell if she needs to get up.

## 2017-01-23 NOTE — ED Notes (Signed)
MD and RN entered room for assessment. Pt sleeping and had to be woken by RN. Upon waking pt reports pain is a 9/10. MD at bedside to continue with assessment.

## 2017-01-24 ENCOUNTER — Emergency Department: Payer: Medicaid Other

## 2017-01-24 LAB — URINE DRUG SCREEN, QUALITATIVE (ARMC ONLY)
Amphetamines, Ur Screen: POSITIVE — AB
Barbiturates, Ur Screen: NOT DETECTED
Benzodiazepine, Ur Scrn: NOT DETECTED
CANNABINOID 50 NG, UR ~~LOC~~: POSITIVE — AB
Cocaine Metabolite,Ur ~~LOC~~: POSITIVE — AB
MDMA (ECSTASY) UR SCREEN: NOT DETECTED
Methadone Scn, Ur: NOT DETECTED
OPIATE, UR SCREEN: POSITIVE — AB
PHENCYCLIDINE (PCP) UR S: NOT DETECTED
Tricyclic, Ur Screen: NOT DETECTED

## 2017-01-24 LAB — CBC WITH DIFFERENTIAL/PLATELET
BAND NEUTROPHILS: 0 %
BASOS PCT: 0 %
Basophils Absolute: 0 10*3/uL (ref 0–0.1)
Blasts: 0 %
EOS ABS: 0 10*3/uL (ref 0–0.7)
EOS PCT: 0 %
HEMATOCRIT: 47.8 % — AB (ref 35.0–47.0)
Hemoglobin: 16.5 g/dL — ABNORMAL HIGH (ref 12.0–16.0)
LYMPHS ABS: 1.2 10*3/uL (ref 1.0–3.6)
LYMPHS PCT: 9 %
MCH: 30 pg (ref 26.0–34.0)
MCHC: 34.5 g/dL (ref 32.0–36.0)
MCV: 86.8 fL (ref 80.0–100.0)
MONO ABS: 1.2 10*3/uL — AB (ref 0.2–0.9)
Metamyelocytes Relative: 0 %
Monocytes Relative: 9 %
Myelocytes: 0 %
NEUTROS PCT: 82 %
NRBC: 0 /100{WBCs}
Neutro Abs: 11.3 10*3/uL — ABNORMAL HIGH (ref 1.4–6.5)
OTHER: 0 %
PLATELETS: 121 10*3/uL — AB (ref 150–440)
Promyelocytes Absolute: 0 %
RBC: 5.5 MIL/uL — ABNORMAL HIGH (ref 3.80–5.20)
RDW: 13.5 % (ref 11.5–14.5)
WBC: 13.7 10*3/uL — ABNORMAL HIGH (ref 3.6–11.0)

## 2017-01-24 LAB — HEPATIC FUNCTION PANEL
ALK PHOS: 68 U/L (ref 38–126)
ALT: 390 U/L — ABNORMAL HIGH (ref 14–54)
AST: 284 U/L — ABNORMAL HIGH (ref 15–41)
Albumin: 3.7 g/dL (ref 3.5–5.0)
BILIRUBIN TOTAL: 1.1 mg/dL (ref 0.3–1.2)
Bilirubin, Direct: 0.2 mg/dL (ref 0.1–0.5)
Indirect Bilirubin: 0.9 mg/dL (ref 0.3–0.9)
Total Protein: 7.2 g/dL (ref 6.5–8.1)

## 2017-01-24 LAB — URINALYSIS, ROUTINE W REFLEX MICROSCOPIC
BACTERIA UA: NONE SEEN
BILIRUBIN URINE: NEGATIVE
Glucose, UA: NEGATIVE mg/dL
Ketones, ur: 80 mg/dL — AB
LEUKOCYTES UA: NEGATIVE
NITRITE: NEGATIVE
PH: 6 (ref 5.0–8.0)
Protein, ur: 30 mg/dL — AB
SPECIFIC GRAVITY, URINE: 1.039 — AB (ref 1.005–1.030)
Squamous Epithelial / LPF: NONE SEEN

## 2017-01-24 LAB — ETHANOL: Alcohol, Ethyl (B): <5 mg/dL (ref <5)

## 2017-01-24 MED ORDER — IOPAMIDOL (ISOVUE-300) INJECTION 61%: 100.0000 mL | Freq: Once | INTRAVENOUS | Status: DC | PRN

## 2017-01-24 MED ORDER — ONDANSETRON HCL 4 MG/2ML IJ SOLN
4.0000 mg | Freq: Once | INTRAMUSCULAR | Status: AC
  Administered 2017-01-24: 4 mg via INTRAVENOUS

## 2017-01-24 MED ORDER — ONDANSETRON HCL 4 MG/2ML IJ SOLN
INTRAMUSCULAR | Status: AC
  Filled 2017-01-24: qty 2

## 2017-01-24 MED ORDER — IOPAMIDOL (ISOVUE-300) INJECTION 61%
75.0000 mL | Freq: Once | INTRAVENOUS | Status: AC | PRN
  Administered 2017-01-24: 75 mL via INTRAVENOUS

## 2017-01-24 MED ORDER — KETOROLAC TROMETHAMINE 30 MG/ML IJ SOLN
INTRAMUSCULAR | Status: AC
  Filled 2017-01-24: qty 1

## 2017-01-24 MED ORDER — KETOROLAC TROMETHAMINE 30 MG/ML IJ SOLN
30.0000 mg | Freq: Once | INTRAMUSCULAR | Status: AC
  Administered 2017-01-24: 30 mg via INTRAVENOUS

## 2017-01-24 NOTE — ED Notes (Signed)
RN asked pt if she knew when she had had surgery on liver. Pt responded, "I don't know." RN asked how old she was when procedure was performed and pt again replied that she did not know. RN asked what was happening in her life when surgery was performed and again pt responded she did not know.

## 2017-01-24 NOTE — ED Notes (Addendum)
RN woke pt again and asked again when surgery was performed and pt stated the surgery had been done in 2009. MD made aware.

## 2017-01-25 LAB — HEPATITIS PANEL, ACUTE
HEP B S AG: NEGATIVE
Hep A IgM: NEGATIVE
Hep B C IgM: NEGATIVE

## 2017-01-28 ENCOUNTER — Telehealth: Payer: Self-pay | Admitting: Emergency Medicine

## 2017-01-28 NOTE — Telephone Encounter (Signed)
Number not in service.  Will send letter.  Would like to discuss lab results and follow up plans with patient

## 2017-02-12 ENCOUNTER — Encounter: Payer: Self-pay | Admitting: Emergency Medicine

## 2017-02-12 DIAGNOSIS — F17210 Nicotine dependence, cigarettes, uncomplicated: Secondary | ICD-10-CM | POA: Insufficient documentation

## 2017-02-12 DIAGNOSIS — F1123 Opioid dependence with withdrawal: Secondary | ICD-10-CM | POA: Insufficient documentation

## 2017-02-12 DIAGNOSIS — Z79899 Other long term (current) drug therapy: Secondary | ICD-10-CM | POA: Insufficient documentation

## 2017-02-12 LAB — CBC
HEMATOCRIT: 43.9 % (ref 35.0–47.0)
HEMOGLOBIN: 15.1 g/dL (ref 12.0–16.0)
MCH: 30.6 pg (ref 26.0–34.0)
MCHC: 34.4 g/dL (ref 32.0–36.0)
MCV: 89 fL (ref 80.0–100.0)
Platelets: 206 10*3/uL (ref 150–440)
RBC: 4.94 MIL/uL (ref 3.80–5.20)
RDW: 14.2 % (ref 11.5–14.5)
WBC: 7.3 10*3/uL (ref 3.6–11.0)

## 2017-02-12 LAB — COMPREHENSIVE METABOLIC PANEL
ALBUMIN: 4.4 g/dL (ref 3.5–5.0)
ALT: 149 U/L — ABNORMAL HIGH (ref 14–54)
AST: 147 U/L — ABNORMAL HIGH (ref 15–41)
Alkaline Phosphatase: 74 U/L (ref 38–126)
Anion gap: 11 (ref 5–15)
BUN: 17 mg/dL (ref 6–20)
CALCIUM: 9.3 mg/dL (ref 8.9–10.3)
CO2: 23 mmol/L (ref 22–32)
Chloride: 104 mmol/L (ref 101–111)
Creatinine, Ser: 0.51 mg/dL (ref 0.44–1.00)
GFR calc Af Amer: >60 mL/min (ref >60)
GFR calc non Af Amer: >60 mL/min (ref >60)
GLUCOSE: 70 mg/dL (ref 65–99)
Potassium: 4.1 mmol/L (ref 3.5–5.1)
SODIUM: 138 mmol/L (ref 135–145)
Total Bilirubin: 1.3 mg/dL — ABNORMAL HIGH (ref 0.3–1.2)
Total Protein: 7.9 g/dL (ref 6.5–8.1)

## 2017-02-12 LAB — LIPASE, BLOOD: Lipase: 16 U/L (ref 11–51)

## 2017-02-12 NOTE — ED Triage Notes (Signed)
Pt to triage via w/c, appears uncomfortable; pt c/o right sided abd pain accomp by N/V; here few wks ago for same and told liver enzymes were elevated

## 2017-02-13 ENCOUNTER — Emergency Department
Admission: EM | Admit: 2017-02-13 | Discharge: 2017-02-13 | Disposition: A | Discharge status: Home / Self Care | Payer: Medicaid Other | Attending: Emergency Medicine | Admitting: Emergency Medicine

## 2017-02-13 DIAGNOSIS — F1193 Opioid use, unspecified with withdrawal: Secondary | ICD-10-CM

## 2017-02-13 DIAGNOSIS — F1123 Opioid dependence with withdrawal: Secondary | ICD-10-CM

## 2017-02-13 MED ORDER — DICYCLOMINE HCL 20 MG PO TABS: 20.0000 mg | ORAL_TABLET | Freq: Three times a day (TID) | ORAL | 0 refills | Status: AC | PRN

## 2017-02-13 MED ORDER — ONDANSETRON 4 MG PO TBDP
4.0000 mg | ORAL_TABLET | Freq: Once | ORAL | Status: AC
  Administered 2017-02-13: 4 mg via ORAL

## 2017-02-13 MED ORDER — ONDANSETRON 4 MG PO TBDP
ORAL_TABLET | ORAL | Status: AC
  Filled 2017-02-13: qty 1

## 2017-02-13 MED ORDER — CLONIDINE HCL 0.1 MG PO TABS
0.1000 mg | ORAL_TABLET | Freq: Once | ORAL | Status: AC
  Administered 2017-02-13: 0.1 mg via ORAL
  Filled 2017-02-13: qty 1

## 2017-02-13 MED ORDER — ONDANSETRON 4 MG PO TBDP: 4.0000 mg | ORAL_TABLET | Freq: Three times a day (TID) | ORAL | 0 refills | Status: AC | PRN

## 2017-02-13 MED ORDER — ONDANSETRON HCL 4 MG/2ML IJ SOLN
4.0000 mg | Freq: Once | INTRAMUSCULAR | Status: AC
  Administered 2017-02-13: 4 mg via INTRAVENOUS

## 2017-02-13 MED ORDER — KETOROLAC TROMETHAMINE 30 MG/ML IJ SOLN
30.0000 mg | Freq: Once | INTRAMUSCULAR | Status: AC
  Administered 2017-02-13: 30 mg via INTRAVENOUS

## 2017-02-13 MED ORDER — DICYCLOMINE HCL 10 MG PO CAPS
10.0000 mg | ORAL_CAPSULE | Freq: Once | ORAL | Status: AC
  Administered 2017-02-13: 10 mg via ORAL
  Filled 2017-02-13: qty 1

## 2017-02-13 MED ORDER — ONDANSETRON HCL 4 MG/2ML IJ SOLN
4.0000 mg | Freq: Once | INTRAMUSCULAR | Status: AC
  Administered 2017-02-13: 4 mg via INTRAVENOUS
  Filled 2017-02-13: qty 2

## 2017-02-13 MED ORDER — ONDANSETRON HCL 4 MG/2ML IJ SOLN
INTRAMUSCULAR | Status: AC
  Filled 2017-02-13: qty 2

## 2017-02-13 MED ORDER — KETOROLAC TROMETHAMINE 30 MG/ML IJ SOLN
INTRAMUSCULAR | Status: AC
  Filled 2017-02-13: qty 1

## 2017-02-13 NOTE — ED Notes (Signed)
MD Manson PasseyBrown and this RN at bedside for initial assessment

## 2017-02-13 NOTE — ED Provider Notes (Signed)
[facility] Emergency Department Provider Note   First MD Initiated Contact with Patient 02/13/17 [phone]     (approximate)  I have reviewed the triage vital signs and the nursing notes.   HISTORY  Chief Complaint Abdominal Pain    HPI Jean Ramirez is a 31 y.o. female with below list of chronic medical conditions presents to the emergency department with generalized abdominal cramping nausea and vomiting 3 days. Of note patient was seen on 01/23/2017 for the same which point the recommendation for admission was given but the patient left AMA. Patient states her current pain score is 10 out of 10. Patient states last drug use was Suboxone 3 days ago.   History reviewed. No pertinent past medical history.  Patient Active Problem List   Diagnosis Date Noted  . Alcohol intoxication (HCC) 01/25/2016  . Severe recurrent major depression without psychotic features (HCC) 01/25/2016  . Involuntary commitment 01/25/2016    Past Surgical History:  Procedure Laterality Date  . CHOLECYSTECTOMY    . LIVER SURGERY    . TUBAL LIGATION      Prior to Admission medications   Medication Sig Start Date End Date Taking? Authorizing Provider  FLUoxetine (PROZAC) 20 MG capsule Take 1 capsule (20 mg total) by mouth daily. 01/25/16   Clapacs, Jackquline Denmark, MD  traZODone (DESYREL) 100 MG tablet Take 1 tablet (100 mg total) by mouth at bedtime. 01/25/16   Clapacs, Jackquline Denmark, MD    Allergies Patient has no known allergies.  No family history on file.  Social History Social History  Substance Use Topics  . Smoking status: Current Every Day Smoker    Packs/day: 1.00  . Smokeless tobacco: Never Used  . Alcohol use Yes    Review of Systems Constitutional: No fever/chills Eyes: No visual changes. ENT: No sore throat. Cardiovascular: Denies chest pain. Respiratory: Denies shortness of breath. Gastrointestinal: Positive for abdominal pain nausea and vomiting Genitourinary:  Negative for dysuria. Musculoskeletal: Negative for neck pain.  Negative for back pain. Integumentary: Negative for rash. Neurological: Negative for headaches, focal weakness or numbness.   ____________________________________________   PHYSICAL EXAM:  VITAL SIGNS: ED Triage Vitals  Enc Vitals Group     BP 02/12/17 2046 106/80     Pulse Rate 02/12/17 2046 84     Resp 02/12/17 2046 (!) 22     Temp 02/12/17 2046 98.2 F (36.8 C)     Temp Source 02/12/17 2046 Oral     SpO2 02/12/17 2046 97 %     Weight 02/12/17 2046 52.2 kg (115 lb)     Height 02/12/17 2046 1.549 m (5\' 1" )     Head Circumference --      Peak Flow --      Pain Score 02/12/17 2051 10     Pain Loc --      Pain Edu? --      Excl. in GC? --     Constitutional: Alert and oriented. Anxious affect, actively vomiting Eyes: Conjunctivae are normal.  Head: Atraumatic. Mouth/Throat: Mucous membranes are moist. Oropharynx non-erythematous. Neck: No stridor.  No meningeal signs.  No cervical spine tenderness to palpation. Cardiovascular: Normal rate, regular rhythm. Good peripheral circulation. Grossly normal heart sounds. Respiratory: Normal respiratory effort.  No retractions. Lungs CTAB. Gastrointestinal: Soft and nontender. No distention.  Musculoskeletal: No lower extremity tenderness nor edema. No gross deformities of extremities. Neurologic:  Normal speech and language. No gross focal neurologic deficits are appreciated.  Skin:  Skin is warm, dry and intact. Diaphoretic Psychiatric: Mood and affect are normal. Speech and behavior are normal.  ____________________________________________   LABS (all labs ordered are listed, but only abnormal results are displayed)  Labs Reviewed  COMPREHENSIVE METABOLIC PANEL - Abnormal; Notable for the following:       Result Value   AST 147 (*)    ALT 149 (*)    Total Bilirubin 1.3 (*)    All other components within normal limits  LIPASE, BLOOD  CBC  URINALYSIS,  COMPLETE (UACMP) WITH MICROSCOPIC  POC URINE PREG, ED     Procedures   ____________________________________________   INITIAL IMPRESSION / ASSESSMENT AND PLAN / ED COURSE  Pertinent labs & imaging results that were available during my care of the patient were reviewed by me and considered in my medical decision making (see chart for details).  31 year old female presenting with history of physical exam consistent with acute opiate withdrawal. Patient given Bentyl and Zofran in the emergency department with resolution of vomiting. I encouraged patient again to allow me to get her into detox however the patient adamantly refused again.      ____________________________________________  FINAL CLINICAL IMPRESSION(S) / ED DIAGNOSES  Final diagnoses:  Opiate withdrawal (HCC)     MEDICATIONS GIVEN DURING THIS VISIT:  Medications  ondansetron (ZOFRAN) injection 4 mg (4 mg Intravenous Given 02/13/17 0100)  dicyclomine (BENTYL) capsule 10 mg (10 mg Oral Given 02/13/17 0100)  cloNIDine (CATAPRES) tablet 0.1 mg (0.1 mg Oral Given 02/13/17 0100)  ondansetron (ZOFRAN) injection 4 mg (4 mg Intravenous Given 02/13/17 0202)  ketorolac (TORADOL) 30 MG/ML injection 30 mg (30 mg Intravenous Given 02/13/17 0202)     NEW OUTPATIENT MEDICATIONS STARTED DURING THIS VISIT:  New Prescriptions   No medications on file    Modified Medications   No medications on file    Discontinued Medications   No medications on file     Note:  This document was prepared using Dragon voice recognition software and may include unintentional dictation errors.    Darci CurrentBrown, [place] N, MD 02/13/17 [phone]

## 2017-02-13 NOTE — BH Assessment (Signed)
Patient presenting to the ED requesting detox from Suboxone.  This Clinical research associatewriter called [facility] to inquire about bed availability.  Spoke to [place], Intake RN who confirmed that there are beds available.  This writer advised patient of the option of placement with [facility].  Pt is in agreement.  Spoke to [place] who confirmed that patient is accepted for placement and will be picked up at 4:30 am.  TTS notified EDP and patient's nurse.

## 2017-02-13 NOTE — ED Notes (Signed)
Pt states using suboxone 3 days ago and withdrawal symptoms starting today. Pt reports wanting to "get clean" but declines detox.

## 2017-02-28 IMAGING — CT CT MAXILLOFACIAL W/O CM
5 of 12 series · 17 of 47 positions shown, 18 images · non-contrast
Comparison: None.

CLINICAL DATA: Facial pain after falling on face 1 hour prior to
arrival. ETOH. Laceration on abrasions.

EXAM:
CT HEAD WITHOUT CONTRAST
CT MAXILLOFACIAL WITHOUT CONTRAST
CT CERVICAL SPINE WITHOUT CONTRAST
TECHNIQUE: Multidetector CT imaging of the head, cervical spine, and
maxillofacial structures were performed using the standard protocol
without intravenous contrast. Multiplanar CT image reconstructions
of the cervical spine and maxillofacial structures were also
generated.

[Series 4: max soft · axial · 0.30mm/px · z∈[-152,-50]mm · 4 of 85 slices shown]
[im 17/85  brain]
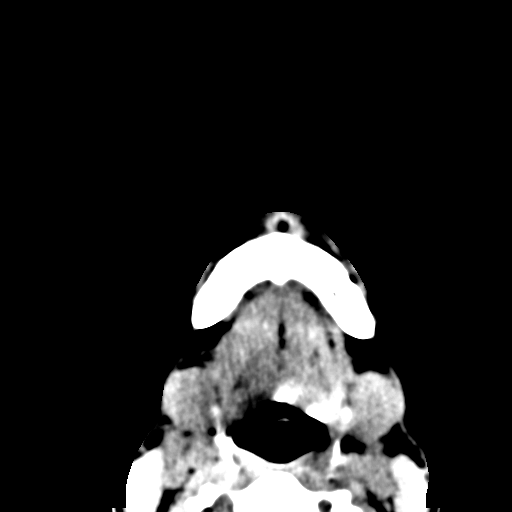
[im 34/85  brain]
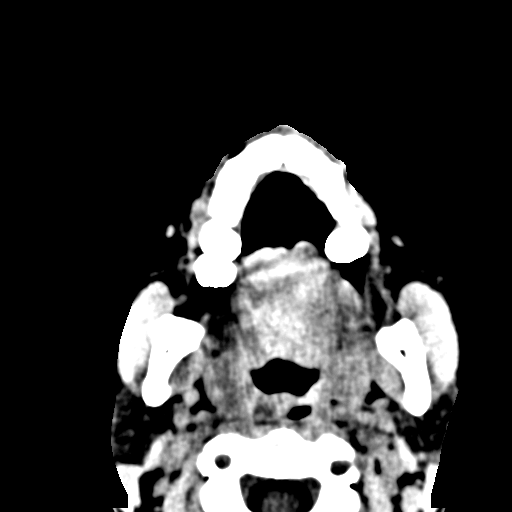
[im 51/85  brain]
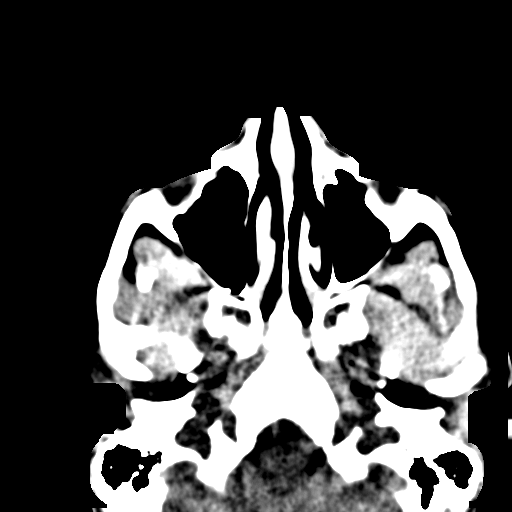
[im 68/85  brain]
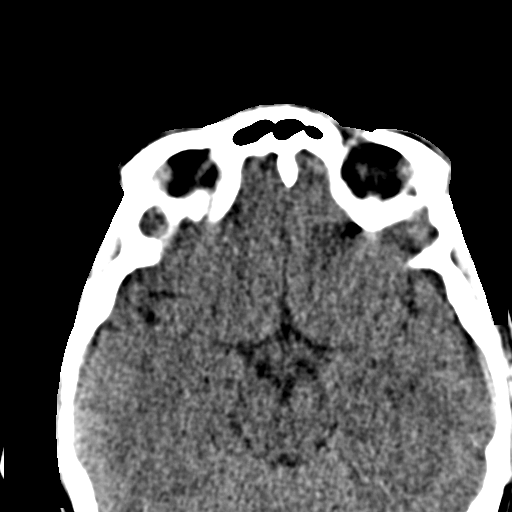

[Series 9: soft tissue · axial · 0.35mm/px · z∈[-218,-98]mm · 5 of 91 slices shown]
[im 16/91  brain]
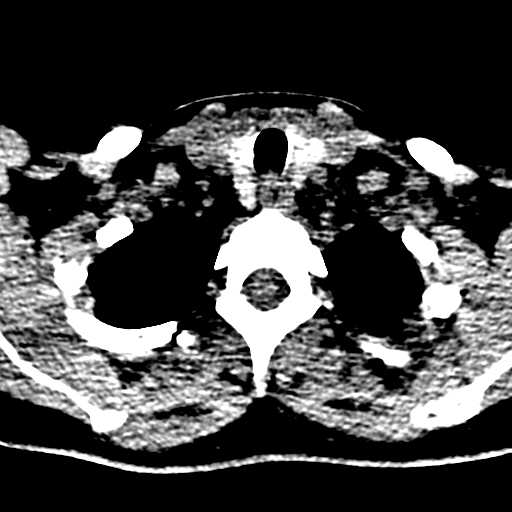
[im 31/91  brain]
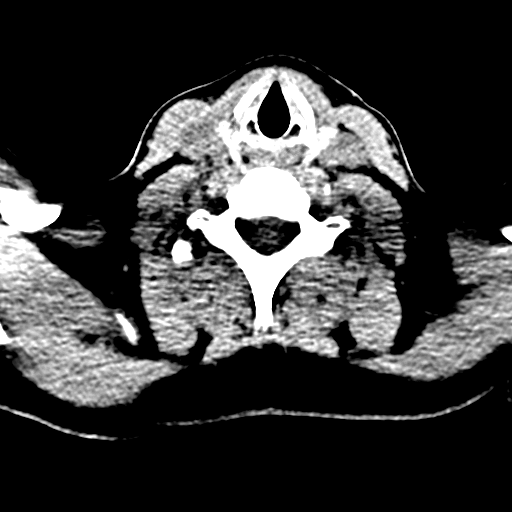
[im 46/91  brain]
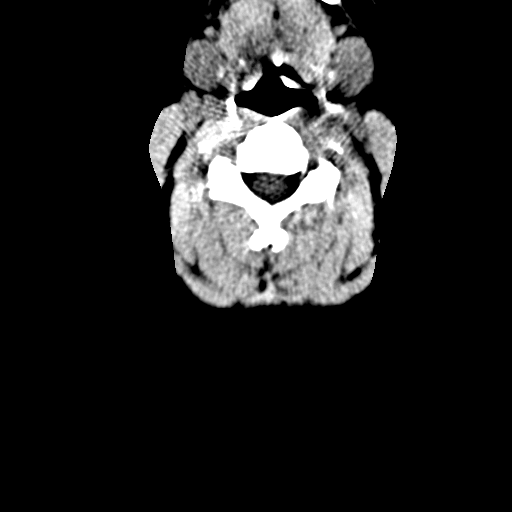
[im 61/91  brain]
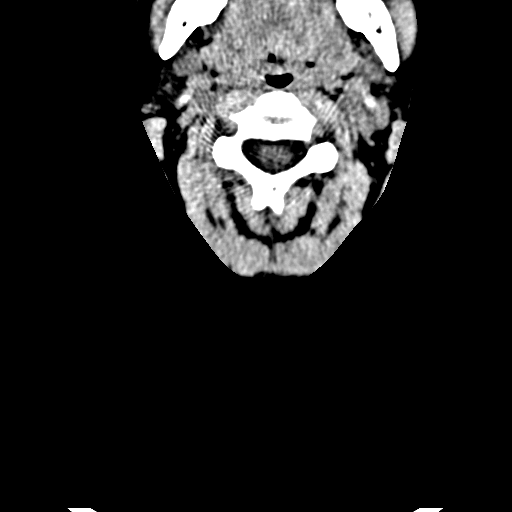
[im 76/91  brain]
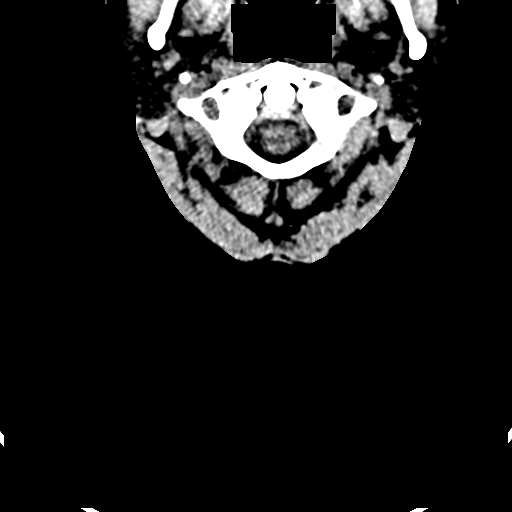

[Series 12: axial · axial · 0.26mm/px · z∈[-228,-129]mm · 4 of 88 slices shown, 5 images]
[im 18/88  brain]
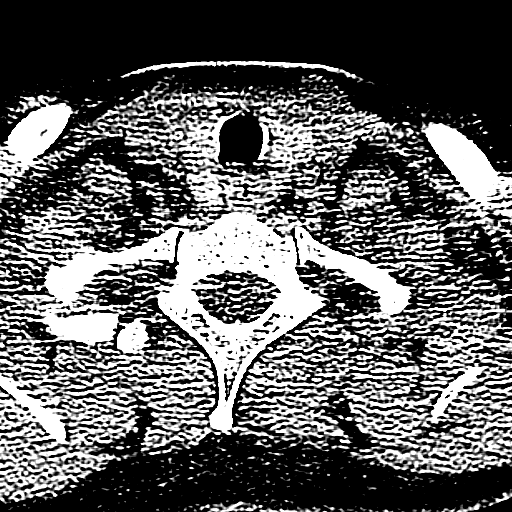
[im 18/88  bone]
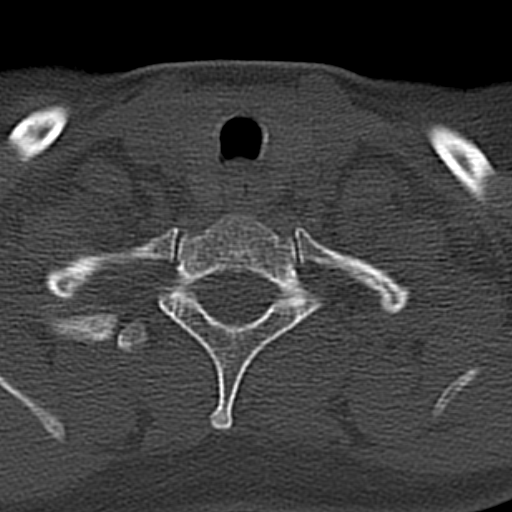
[im 35/88  bone]
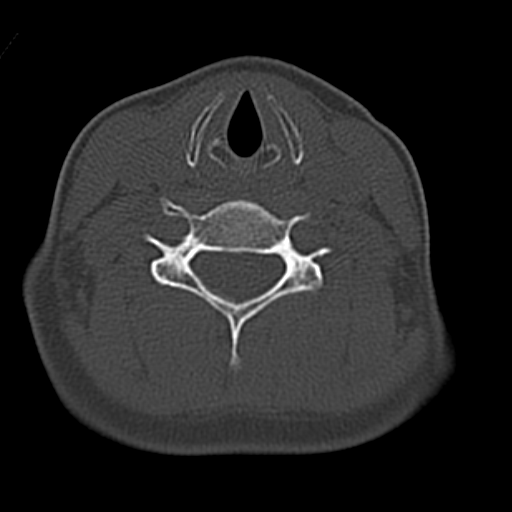
[im 53/88  bone]
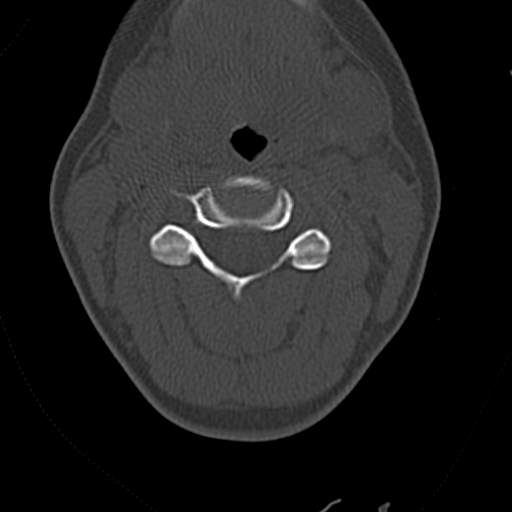
[im 70/88  bone]
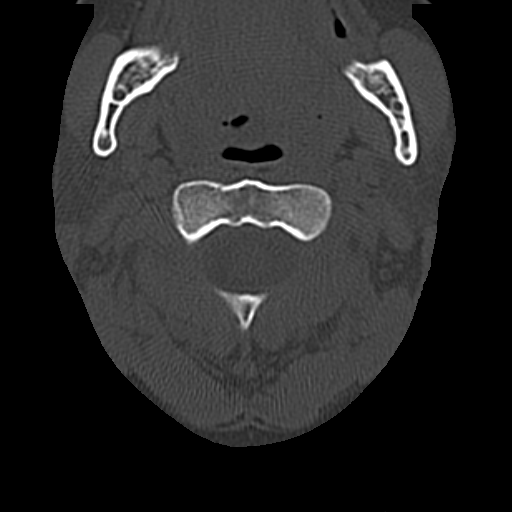

[Series 15: coronal soft · coronal · 0.31mm/px · 3 of 70 slices shown]
[im 18/70  bone]
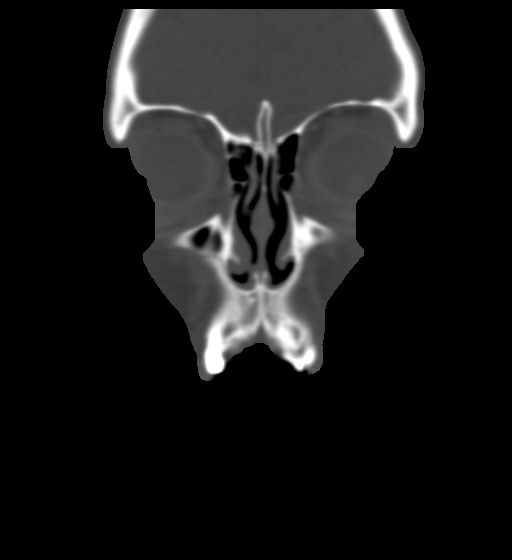
[im 35/70  bone]
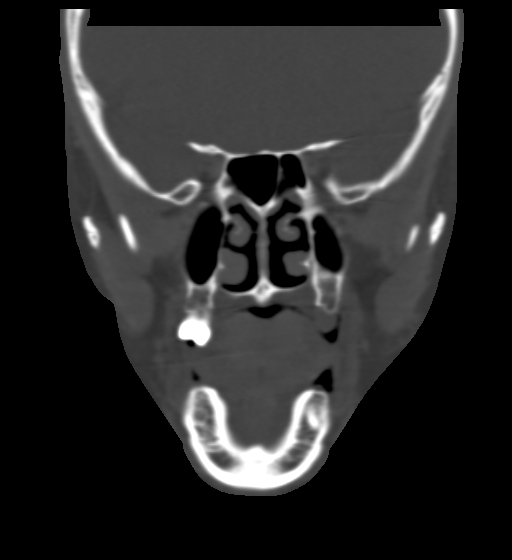
[im 52/70  bone]
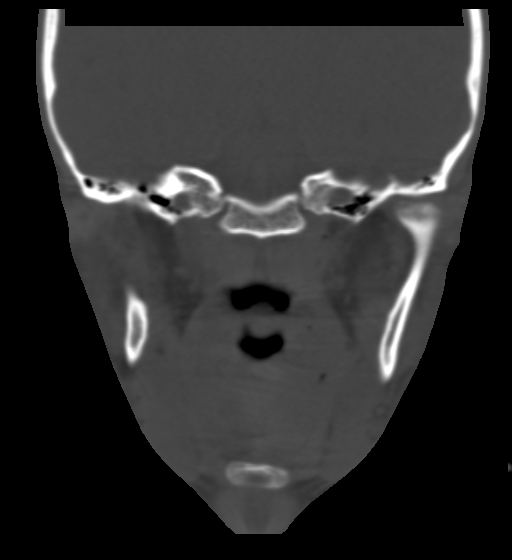

[Series 16: sagittal soft · sagittal · 0.28mm/px · 1 of 73 slices shown]
[im 37/73  bone]
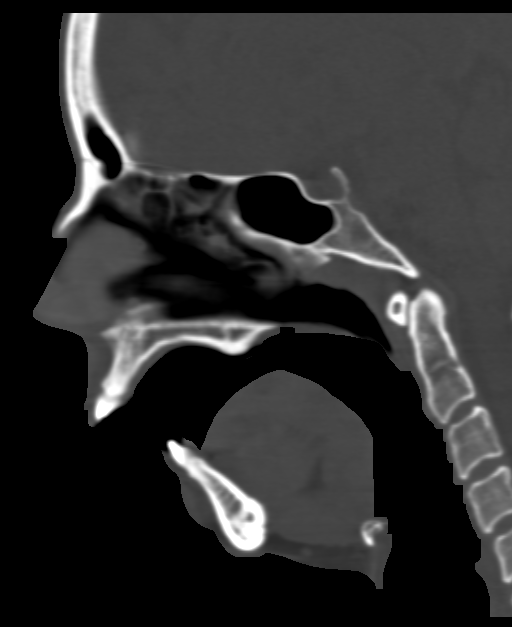

[17 of 47 positions shown; findings below may reference images not displayed]

FINDINGS: CT HEAD FINDINGS

No intracranial hemorrhage, mass effect, or midline shift. No
hydrocephalus. The basilar cisterns are patent. No evidence of
territorial infarct. No intracranial fluid collection. Calvarium is
intact. The mastoid air cells are well aerated.

CT MAXILLOFACIAL FINDINGS

No facial bone fracture. The orbits and globes are intact. The nasal
bone, mandibles, zygomatic arches and pterygoid plates are intact.
Scattered mucosal thickening of the ethmoid air cells. Remaining
paranasal sinuses are clear. Multiple absent teeth. No radiopaque
foreign body or localizing soft tissue abnormality.

CT CERVICAL SPINE FINDINGS

No fracture or acute subluxation. The dens is intact. There are no
jumped or perched facets. Vertebral body heights and intervertebral
disc spaces are preserved. No prevertebral soft tissue edema.
IMPRESSION: 1. No acute intracranial abnormality.
2. No facial bone fracture.
3. No acute fracture or subluxation of cervical spine.

## 2019-08-14 ENCOUNTER — Emergency Department: Payer: Medicaid Other

## 2019-08-14 ENCOUNTER — Encounter: Payer: Self-pay | Admitting: Emergency Medicine

## 2019-08-14 ENCOUNTER — Emergency Department
Admission: EM | Admit: 2019-08-14 | Discharge: 2019-08-14 | Disposition: A | Discharge status: Home / Self Care | Payer: Medicaid Other | Attending: Emergency Medicine | Admitting: Emergency Medicine

## 2019-08-14 ENCOUNTER — Other Ambulatory Visit: Payer: Self-pay

## 2019-08-14 DIAGNOSIS — R1011 Right upper quadrant pain: Secondary | ICD-10-CM | POA: Insufficient documentation

## 2019-08-14 DIAGNOSIS — K7460 Unspecified cirrhosis of liver: Secondary | ICD-10-CM | POA: Insufficient documentation

## 2019-08-14 DIAGNOSIS — F17210 Nicotine dependence, cigarettes, uncomplicated: Secondary | ICD-10-CM | POA: Insufficient documentation

## 2019-08-14 DIAGNOSIS — R609 Edema, unspecified: Secondary | ICD-10-CM | POA: Insufficient documentation

## 2019-08-14 DIAGNOSIS — Z79899 Other long term (current) drug therapy: Secondary | ICD-10-CM | POA: Insufficient documentation

## 2019-08-14 DIAGNOSIS — K7689 Other specified diseases of liver: Secondary | ICD-10-CM | POA: Insufficient documentation

## 2019-08-14 LAB — CBC
HCT: 33.5 % — ABNORMAL LOW (ref 36.0–46.0)
Hemoglobin: 11.5 g/dL — ABNORMAL LOW (ref 12.0–15.0)
MCH: 29.6 pg (ref 26.0–34.0)
MCHC: 34.3 g/dL (ref 30.0–36.0)
MCV: 86.1 fL (ref 80.0–100.0)
Platelets: 140 10*3/uL — ABNORMAL LOW (ref 150–400)
RBC: 3.89 MIL/uL (ref 3.87–5.11)
RDW: 12.7 % (ref 11.5–15.5)
WBC: 5.9 10*3/uL (ref 4.0–10.5)
nRBC: 0 % (ref 0.0–0.2)

## 2019-08-14 LAB — HEPATIC FUNCTION PANEL
ALT: 54 U/L — ABNORMAL HIGH (ref 0–44)
AST: 50 U/L — ABNORMAL HIGH (ref 15–41)
Albumin: 3.2 g/dL — ABNORMAL LOW (ref 3.5–5.0)
Alkaline Phosphatase: 44 U/L (ref 38–126)
Bilirubin, Direct: <0.1 mg/dL (ref 0.0–0.2)
Total Bilirubin: 0.4 mg/dL (ref 0.3–1.2)
Total Protein: 5.9 g/dL — ABNORMAL LOW (ref 6.5–8.1)

## 2019-08-14 LAB — URINALYSIS, COMPLETE (UACMP) WITH MICROSCOPIC
Bacteria, UA: NONE SEEN
Bilirubin Urine: NEGATIVE
Glucose, UA: NEGATIVE mg/dL
Hgb urine dipstick: NEGATIVE
Ketones, ur: NEGATIVE mg/dL
Leukocytes,Ua: NEGATIVE
Nitrite: NEGATIVE
Protein, ur: NEGATIVE mg/dL
Specific Gravity, Urine: 1.012 (ref 1.005–1.030)
pH: 7 (ref 5.0–8.0)

## 2019-08-14 LAB — BASIC METABOLIC PANEL
Anion gap: 7 (ref 5–15)
BUN: 11 mg/dL (ref 6–20)
CO2: 25 mmol/L (ref 22–32)
Calcium: 8.7 mg/dL — ABNORMAL LOW (ref 8.9–10.3)
Chloride: 107 mmol/L (ref 98–111)
Creatinine, Ser: 0.36 mg/dL — ABNORMAL LOW (ref 0.44–1.00)
GFR calc Af Amer: >60 mL/min (ref >60)
GFR calc non Af Amer: >60 mL/min (ref >60)
Glucose, Bld: 114 mg/dL — ABNORMAL HIGH (ref 70–99)
Potassium: 3.6 mmol/L (ref 3.5–5.1)
Sodium: 139 mmol/L (ref 135–145)

## 2019-08-14 LAB — POCT PREGNANCY, URINE: Preg Test, Ur: NEGATIVE

## 2019-08-14 LAB — BRAIN NATRIURETIC PEPTIDE: B Natriuretic Peptide: 211 pg/mL — ABNORMAL HIGH (ref 0.0–100.0)

## 2019-08-14 LAB — TROPONIN I (HIGH SENSITIVITY): Troponin I (High Sensitivity): 2 ng/L (ref <18)

## 2019-08-14 MED ORDER — FUROSEMIDE 20 MG PO TABS: 20.0000 mg | ORAL_TABLET | Freq: Every day | ORAL | 0 refills | Status: AC

## 2019-08-14 NOTE — ED Triage Notes (Signed)
Pt here with c/o bilateral lower extremity edema for the past 6 days now, tried to wait it out at home with no success with elevation. Denies birth control use, denies flying or being in the car for hours. Denies HTN or Covid. States her legs ache. NAD. Breathing WNL.

## 2019-08-14 NOTE — ED Provider Notes (Signed)
[facility] Emergency Department Provider Note  Time seen: 9:41 AM  I have reviewed the triage vital signs and the nursing notes.   HISTORY  Chief Complaint Leg Swelling   HPI Jean Ramirez is a 33 y.o. female with a past medical history of hepatitis C, currently on methadone treatment, presents to the emergency department for lower extremity edema.  According to the patient for the past 1 week she has had worsening lower extremity edema bilaterally.  No history of edema previously.  No chest pain no shortness of breath.  Patient denies any recent cough or fever.  Denies significant abdominal discomfort but does state mild swelling in her abdomen as well.   States mild leg discomfort bilaterally due to the swelling.  History reviewed. No pertinent past medical history.  Patient Active Problem List   Diagnosis Date Noted  . Alcohol intoxication (HCC) 01/25/2016  . Severe recurrent major depression without psychotic features (HCC) 01/25/2016  . Involuntary commitment 01/25/2016    Past Surgical History:  Procedure Laterality Date  . CHOLECYSTECTOMY    . LIVER SURGERY    . TUBAL LIGATION      Prior to Admission medications   Medication Sig Start Date End Date Taking? Authorizing Provider  dicyclomine (BENTYL) 20 MG tablet Take 1 tablet (20 mg total) by mouth 3 (three) times daily as needed for spasms. 02/13/17 02/13/18  Darci Current, MD  FLUoxetine (PROZAC) 20 MG capsule Take 1 capsule (20 mg total) by mouth daily. 01/25/16   Clapacs, Jackquline Denmark, MD  ondansetron (ZOFRAN ODT) 4 MG disintegrating tablet Take 1 tablet (4 mg total) by mouth every 8 (eight) hours as needed for nausea or vomiting. 02/13/17   Darci Current, MD  traZODone (DESYREL) 100 MG tablet Take 1 tablet (100 mg total) by mouth at bedtime. 01/25/16   Clapacs, Jackquline Denmark, MD    No Known Allergies  No family history on file.  Social History Social History   Tobacco Use  . Smoking status:  Current Every Day Smoker    Packs/day: 1.00  . Smokeless tobacco: Never Used  Substance Use Topics  . Alcohol use: Yes  . Drug use: Not on file    Review of Systems Constitutional: Negative for fever. Cardiovascular: Negative for chest pain. Respiratory: Negative for shortness of breath.  For cough. Gastrointestinal: Negative for abdominal pain.  Mild swelling. Musculoskeletal: Lateral lower extremity edema/swelling with mild dull discomfort bilaterally. Neurological: Negative for headache All other ROS negative  ____________________________________________   PHYSICAL EXAM:  VITAL SIGNS: ED Triage Vitals  Enc Vitals Group     BP 08/14/19 0816 133/77     Pulse Rate 08/14/19 0816 83     Resp 08/14/19 0816 16     Temp 08/14/19 0816 99 F (37.2 C)     Temp Source 08/14/19 0816 Oral     SpO2 08/14/19 0816 97 %     Weight 08/14/19 0816 125 lb (56.7 kg)     Height 08/14/19 0816 5\' 1"  (1.549 m)     Head Circumference --      Peak Flow --      Pain Score 08/14/19 0838 7     Pain Loc --      Pain Edu? --      Excl. in GC? --    Constitutional: Alert and oriented. Well appearing and in no distress. Eyes: Normal exam ENT      Head: Normocephalic and atraumatic.  Mouth/Throat: Mucous membranes are moist. Cardiovascular: Normal rate, regular rhythm.  Respiratory: Normal respiratory effort without tachypnea nor retractions. Breath sounds are clear Gastrointestinal: Soft and nontender. No distention.  Musculoskeletal: 2+ lower extremity edema bilaterally, pitting.  No erythema.  No signs of cellulitis. Neurologic:  Normal speech and language. No gross focal neurologic deficits  Skin:  Skin is warm, dry and intact.  Psychiatric: Mood and affect are normal.   ____________________________________________    RADIOLOGY   IMPRESSION:  Postop cholecystectomy   No liver mass   Cavernous transformation portal vein.    ____________________________________________   INITIAL IMPRESSION / ASSESSMENT AND PLAN / ED COURSE  Pertinent labs & imaging results that were available during my care of the patient were reviewed by me and considered in my medical decision making (see chart for details).   Patient presents to the emergency department for bilateral lower extremity edema/swelling/discomfort.  Differential would include CHF, cirrhosis leading to edema, less likely DVT given bilateral symptoms.  No signs of cellulitis on exam.  We will check labs including hepatic function panel as well as troponin and BNP.  We will obtain a right upper quadrant ultrasound to evaluate for cirrhosis.  Patient states she was diagnosed with hepatitis C but is not obtained any treatment, is not sure the status of her liver and does not have a doctor she follows up with.  Patient's ultrasound shows cavernous transformation, status post left hepatic lobe resection.  I reviewed the patient's history including her care everywhere chart.  Patient has had a very complicated GI history that originated with a cholecystectomy complicated by bile leak ultimately requiring a Roux-en-Y surgery, then Roux-en-Y takedown with hepatic resection in 2009.  Patient was following up with [facility] for these issues but has not seen them in 1 year.  Given the patient's peripheral edema I discussed with patient she needs to be seen by [facility] as soon as possible however given her fairly reassuring work-up thus far I believe the patient would be safe for discharge home with [facility] follow-up.  We will start the patient on a low-dose of Lasix to help with her peripheral edema.  Patient agreeable to plan of care.  Patient states she will call [facility] today to arrange the next available appointment.  I discussed with the patient importance of getting in next week.  Jean Ramirez was evaluated in Emergency Department on 08/14/2019 for the symptoms described in the history  of present illness. She was evaluated in the context of the global COVID-19 pandemic, which necessitated consideration that the patient might be at risk for infection with the SARS-CoV-2 virus that causes COVID-19. Institutional protocols and algorithms that pertain to the evaluation of patients at risk for COVID-19 are in a state of rapid change based on information released by regulatory bodies including the CDC and federal and state organizations. These policies and algorithms were followed during the patient's care in the ED.  ____________________________________________   FINAL CLINICAL IMPRESSION(S) / ED DIAGNOSES  Peripheral edema   Harvest Dark, MD 08/14/19 1158

## 2019-08-14 NOTE — ED Notes (Signed)
Pt presents to the ED from home. Pt c/o bilateral leg swelling for about 6 days, pt states that there was no injury to her legs. Pt also c/o pain in her inner thighs. On assessment, pt has full movement in both lower extremities but mild pitting edema noted. Pulses are present in both lower extremities.

## 2019-08-14 NOTE — ED Notes (Signed)
Patient transported to Ultrasound 

## 2019-08-14 NOTE — Discharge Instructions (Signed)
As we discussed please call the number provided for GI medicine at [facility] today to arrange next available appointment preferably this coming week.  Return to the emergency department for any worsening swelling development of abdominal pain or any other symptom personally concerning to yourself.

## 2020-09-17 IMAGING — US US ABDOMEN LIMITED
1 series · 14 of 25 positions shown · non-contrast
Comparison: CT abdomen 01/24/2017

CLINICAL DATA: Right upper quadrant pain.  Hepatic cirrhosis

EXAM:
ULTRASOUND ABDOMEN LIMITED RIGHT UPPER QUADRANT

[Series 1: us abdomen limited · 36 acquisitions, 14 frames shown]
[im 1/36]
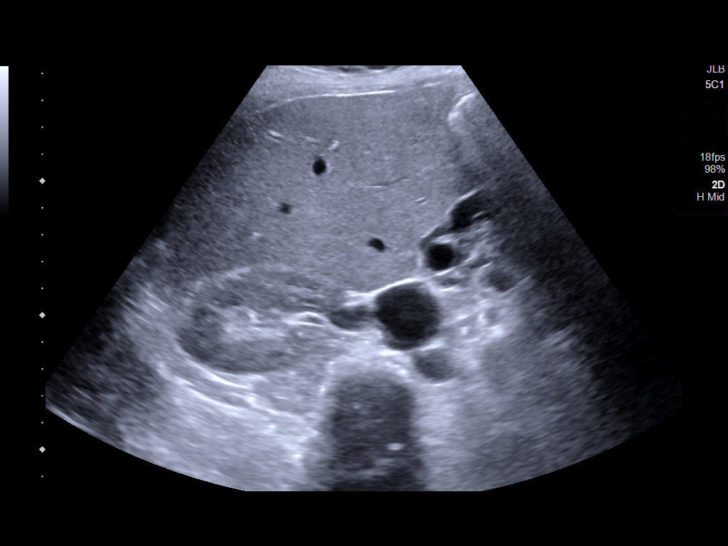
[im 3/36]
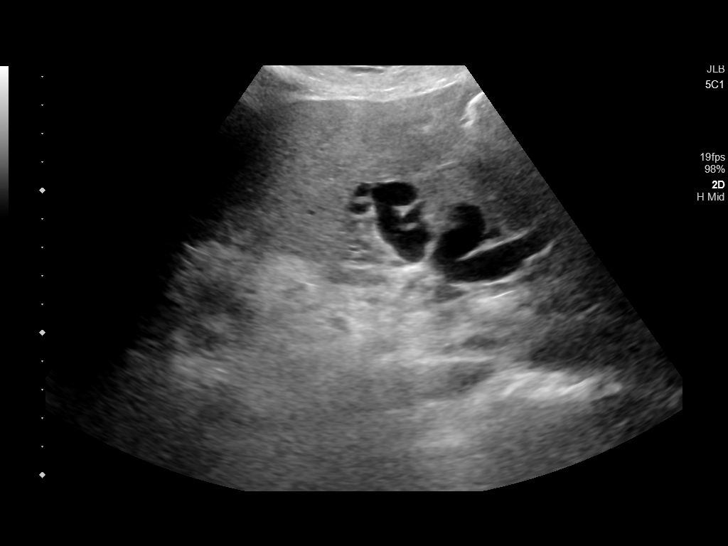
[im 6/36]
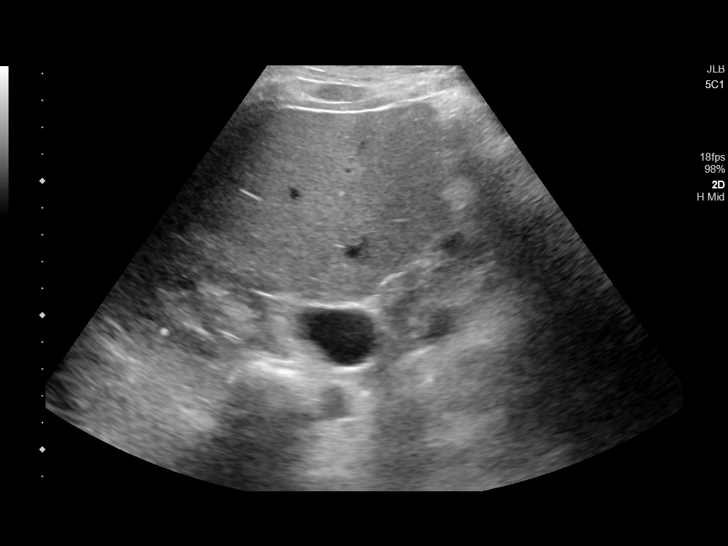
[im 9/36]
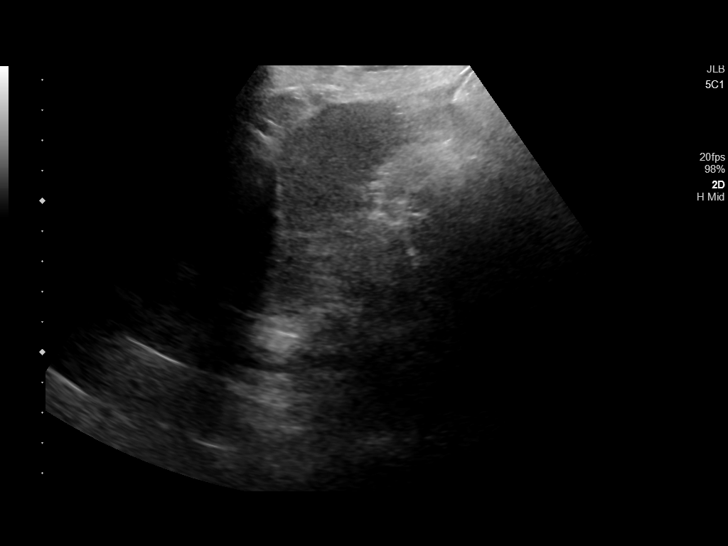
[im 12/36]
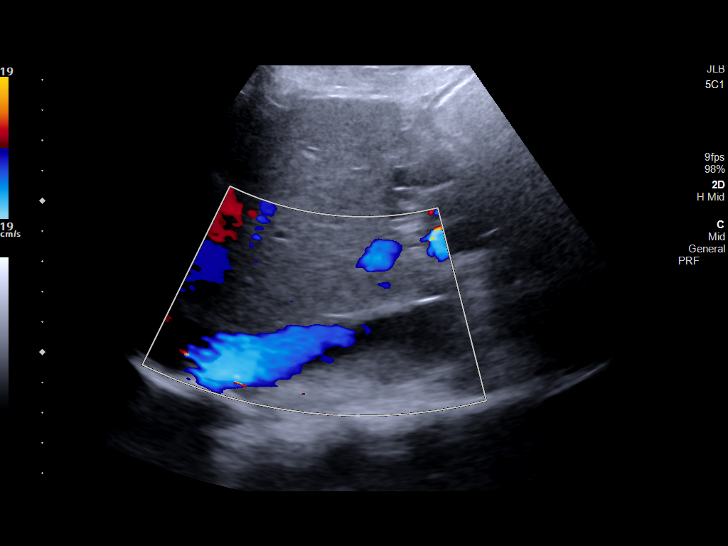
[im 14/36]
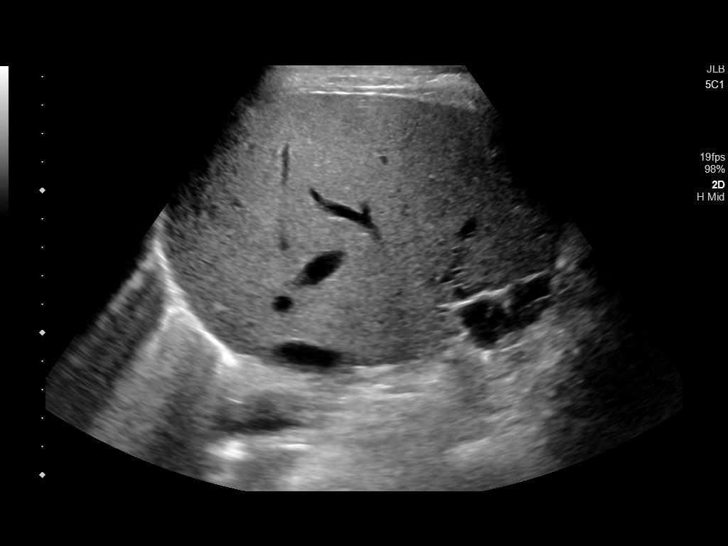
[im 17/36]
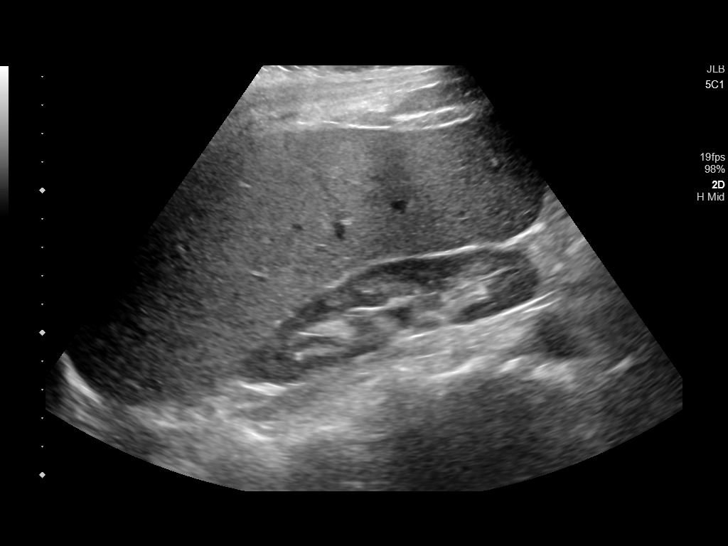
[im 19/36]
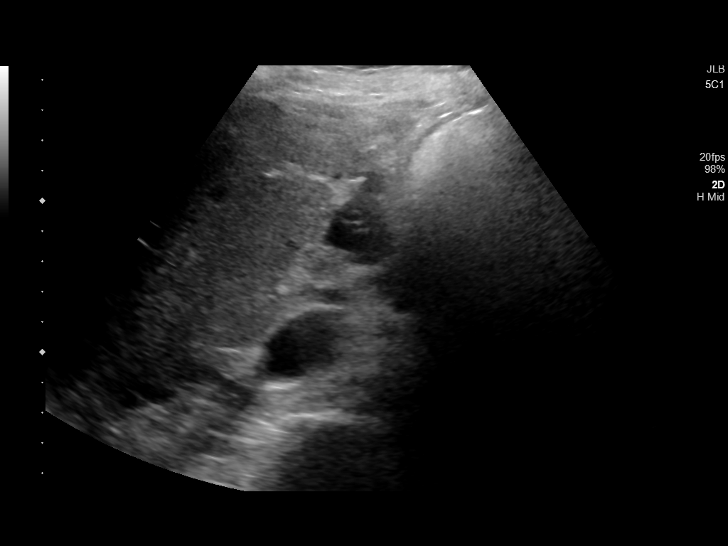
[im 22/36]
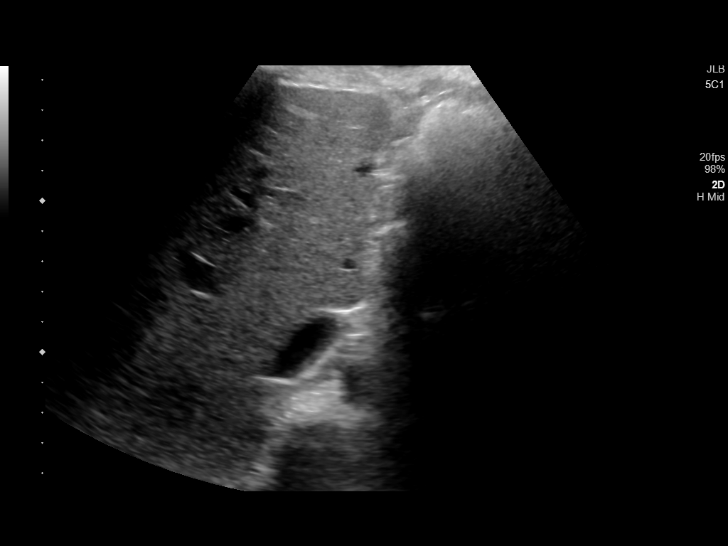
[im 24/36]
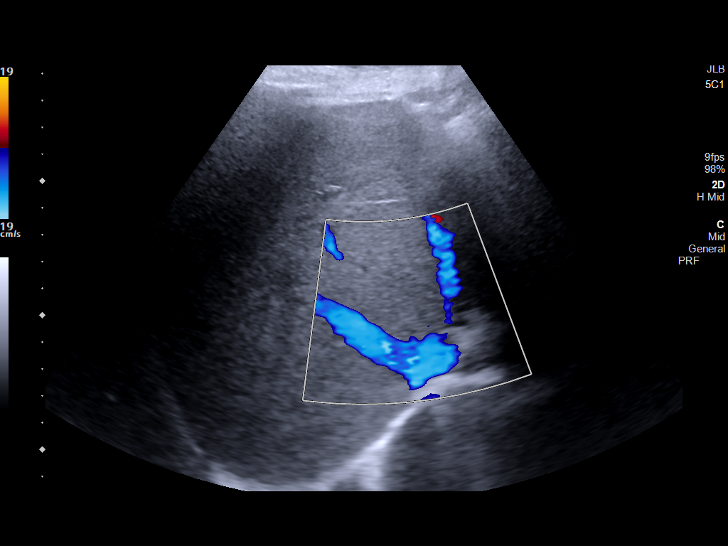
[im 27/36]
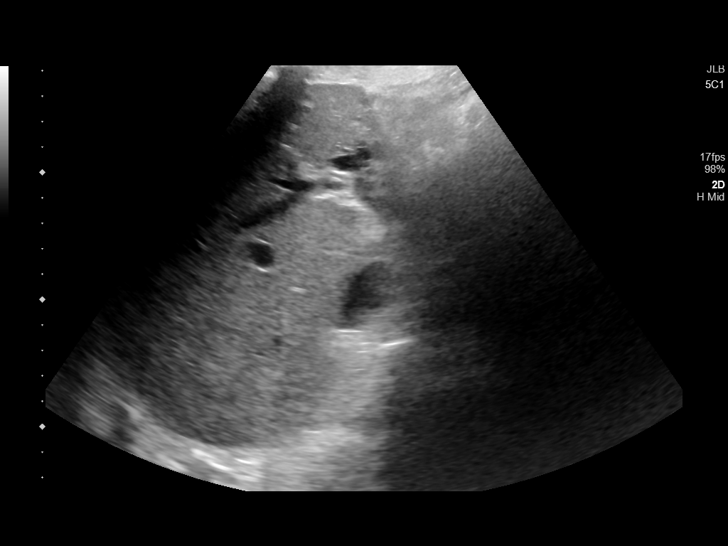
[im 30/36]
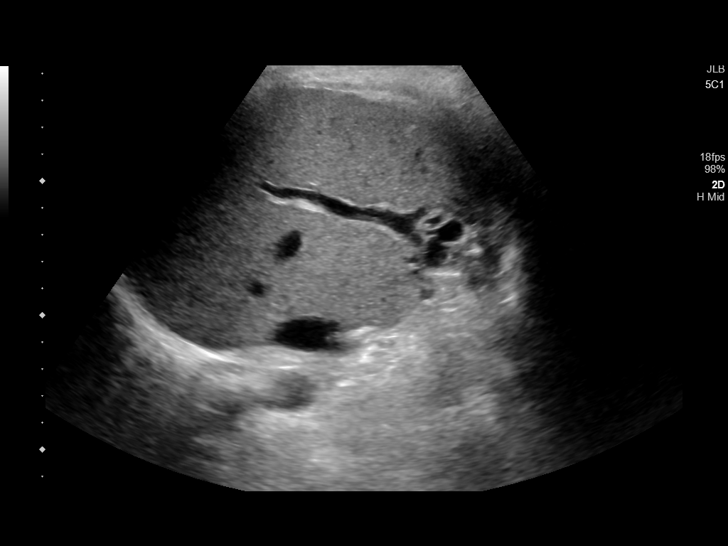
[im 33/36]
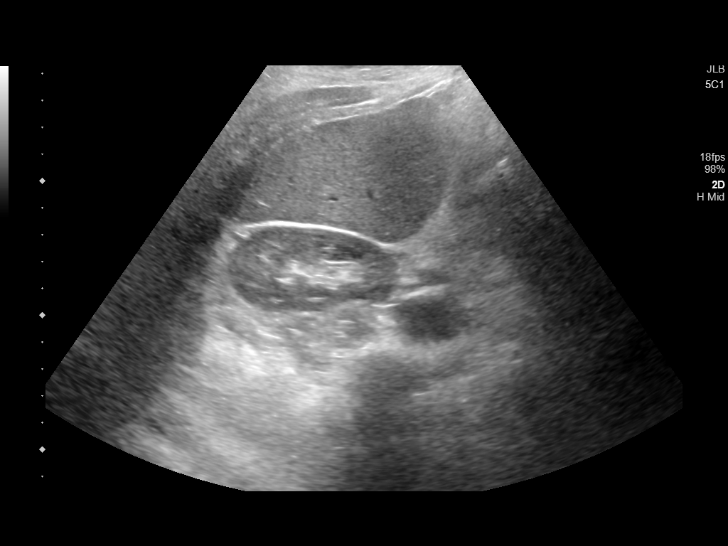
[im 36/36]
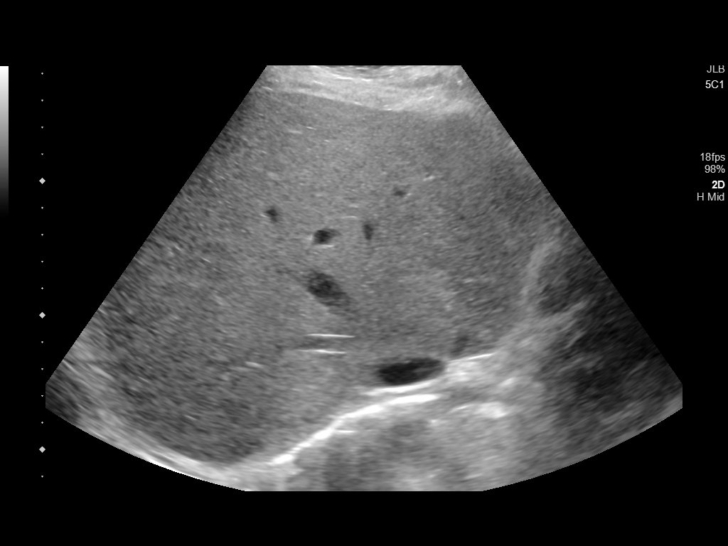

[14 of 25 positions shown; findings below may reference images not displayed]

FINDINGS: Gallbladder:

Surgically absent gallbladder

Common bile duct:

Diameter: Common bile duct difficult to visualize due to cavernous
transformation of the portal vein with numerous dilated vessels in
the porta hepatis.

Liver:

Left hepatectomy. No liver mass. Multiple dilated vessels in the
porta hepatis compatible with cavernous transformation portal vein
as noted on prior CT.

Other: Negative for ascites.
IMPRESSION: Postop cholecystectomy

No liver mass

Cavernous transformation portal vein.

## 2022-11-17 DIAGNOSIS — T401X1A Poisoning by heroin, accidental (unintentional), initial encounter: Secondary | ICD-10-CM | POA: Diagnosis not present

## 2022-11-17 DIAGNOSIS — T50904A Poisoning by unspecified drugs, medicaments and biological substances, undetermined, initial encounter: Secondary | ICD-10-CM | POA: Diagnosis not present

## 2023-02-25 DEATH — deceased
